# Patient Record
Sex: Male | Born: 1990 | Race: White | Hispanic: No | Marital: Single | Smoking: Current every day smoker
Health system: Southern US, Community
[De-identification: ages and names within clinical notes are randomized; demographics above are authoritative.]

## PROBLEM LIST (undated history)

## (undated) DIAGNOSIS — Z720 Tobacco use: Secondary | ICD-10-CM

## (undated) DIAGNOSIS — K219 Gastro-esophageal reflux disease without esophagitis: Secondary | ICD-10-CM

## (undated) DIAGNOSIS — IMO0002 Reserved for concepts with insufficient information to code with codable children: Secondary | ICD-10-CM

## (undated) HISTORY — PX: APPENDECTOMY: SHX54

---
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
## 2010-02-03 ENCOUNTER — Ambulatory Visit (HOSPITAL_COMMUNITY): Admission: RE | Admit: 2010-02-03 | Discharge: 2010-02-03 | Payer: Self-pay | Admitting: Family Medicine

## 2013-06-04 ENCOUNTER — Encounter (HOSPITAL_COMMUNITY): Payer: Self-pay | Admitting: Emergency Medicine

## 2013-06-04 ENCOUNTER — Emergency Department (HOSPITAL_COMMUNITY): Payer: Worker's Compensation | Admitting: Anesthesiology

## 2013-06-04 ENCOUNTER — Encounter (HOSPITAL_COMMUNITY): Payer: Worker's Compensation | Admitting: Anesthesiology

## 2013-06-04 ENCOUNTER — Encounter (HOSPITAL_COMMUNITY)
Admission: EM | Disposition: A | Discharge status: Home / Self Care | Payer: Self-pay | Source: Home / Self Care | Attending: Emergency Medicine

## 2013-06-04 ENCOUNTER — Ambulatory Visit (HOSPITAL_COMMUNITY)
Admission: EM | Admit: 2013-06-04 | Discharge: 2013-06-04 | Disposition: A | Discharge status: Home / Self Care | Payer: Worker's Compensation | Attending: Emergency Medicine | Admitting: Emergency Medicine

## 2013-06-04 ENCOUNTER — Emergency Department (HOSPITAL_COMMUNITY): Payer: Worker's Compensation

## 2013-06-04 DIAGNOSIS — F17200 Nicotine dependence, unspecified, uncomplicated: Secondary | ICD-10-CM | POA: Insufficient documentation

## 2013-06-04 DIAGNOSIS — S62309B Unspecified fracture of unspecified metacarpal bone, initial encounter for open fracture: Secondary | ICD-10-CM

## 2013-06-04 DIAGNOSIS — S62329B Displaced fracture of shaft of unspecified metacarpal bone, initial encounter for open fracture: Secondary | ICD-10-CM | POA: Insufficient documentation

## 2013-06-04 DIAGNOSIS — Y990 Civilian activity done for income or pay: Secondary | ICD-10-CM | POA: Insufficient documentation

## 2013-06-04 DIAGNOSIS — W3189XA Contact with other specified machinery, initial encounter: Secondary | ICD-10-CM | POA: Insufficient documentation

## 2013-06-04 DIAGNOSIS — S65509A Unspecified injury of blood vessel of unspecified finger, initial encounter: Secondary | ICD-10-CM | POA: Insufficient documentation

## 2013-06-04 DIAGNOSIS — Z7982 Long term (current) use of aspirin: Secondary | ICD-10-CM | POA: Insufficient documentation

## 2013-06-04 DIAGNOSIS — S6710XA Crushing injury of unspecified finger(s), initial encounter: Secondary | ICD-10-CM | POA: Insufficient documentation

## 2013-06-04 DIAGNOSIS — Y9269 Other specified industrial and construction area as the place of occurrence of the external cause: Secondary | ICD-10-CM | POA: Insufficient documentation

## 2013-06-04 HISTORY — PX: OPEN REDUCTION INTERNAL FIXATION (ORIF) HAND: SHX5991

## 2013-06-04 LAB — BASIC METABOLIC PANEL
BUN: 11 mg/dL (ref 6–23)
CO2: 24 meq/L (ref 19–32)
CREATININE: 1.96 mg/dL — AB (ref 0.50–1.35)
Calcium: 8.7 mg/dL (ref 8.4–10.5)
Chloride: 99 mEq/L (ref 96–112)
GFR calc Af Amer: 54 mL/min — ABNORMAL LOW (ref >90)
GFR calc non Af Amer: 46 mL/min — ABNORMAL LOW (ref >90)
Glucose, Bld: 84 mg/dL (ref 70–99)
POTASSIUM: 3.7 meq/L (ref 3.7–5.3)
Sodium: 137 mEq/L (ref 137–147)

## 2013-06-04 LAB — CBC WITH DIFFERENTIAL/PLATELET
Basophils Absolute: 0 10*3/uL (ref 0.0–0.1)
Basophils Relative: 0 % (ref 0–1)
Eosinophils Absolute: 0.2 10*3/uL (ref 0.0–0.7)
Eosinophils Relative: 1 % (ref 0–5)
HEMATOCRIT: 44.6 % (ref 39.0–52.0)
HEMOGLOBIN: 15.5 g/dL (ref 13.0–17.0)
Lymphocytes Relative: 14 % (ref 12–46)
Lymphs Abs: 1.7 10*3/uL (ref 0.7–4.0)
MCH: 32.9 pg (ref 26.0–34.0)
MCHC: 34.8 g/dL (ref 30.0–36.0)
MCV: 94.7 fL (ref 78.0–100.0)
MONO ABS: 1.6 10*3/uL — AB (ref 0.1–1.0)
MONOS PCT: 13 % — AB (ref 3–12)
NEUTROS ABS: 9 10*3/uL — AB (ref 1.7–7.7)
Neutrophils Relative %: 72 % (ref 43–77)
Platelets: 215 10*3/uL (ref 150–400)
RBC: 4.71 MIL/uL (ref 4.22–5.81)
RDW: 13 % (ref 11.5–15.5)
WBC: 12.5 10*3/uL — ABNORMAL HIGH (ref 4.0–10.5)

## 2013-06-04 SURGERY — OPEN REDUCTION INTERNAL FIXATION (ORIF) HAND
Anesthesia: General | Site: Hand | Laterality: Right

## 2013-06-04 MED ORDER — LIDOCAINE HCL (CARDIAC) 20 MG/ML IV SOLN
INTRAVENOUS | Status: AC
  Filled 2013-06-04: qty 5

## 2013-06-04 MED ORDER — DEXAMETHASONE SODIUM PHOSPHATE 4 MG/ML IJ SOLN
INTRAMUSCULAR | Status: DC | PRN
  Administered 2013-06-04: 8 mg via INTRAVENOUS

## 2013-06-04 MED ORDER — MIDAZOLAM HCL 5 MG/5ML IJ SOLN
INTRAMUSCULAR | Status: DC | PRN
  Administered 2013-06-04: 2 mg via INTRAVENOUS

## 2013-06-04 MED ORDER — OXYCODONE-ACETAMINOPHEN 5-325 MG PO TABS: ORAL_TABLET | ORAL | Status: DC

## 2013-06-04 MED ORDER — HYDROMORPHONE HCL PF 1 MG/ML IJ SOLN
1.0000 mg | Freq: Once | INTRAMUSCULAR | Status: AC
  Administered 2013-06-04: 1 mg via INTRAVENOUS
  Filled 2013-06-04: qty 1

## 2013-06-04 MED ORDER — FENTANYL CITRATE 0.05 MG/ML IJ SOLN
INTRAMUSCULAR | Status: AC
  Filled 2013-06-04: qty 5

## 2013-06-04 MED ORDER — LIDOCAINE HCL (CARDIAC) 20 MG/ML IV SOLN
INTRAVENOUS | Status: DC | PRN
  Administered 2013-06-04: 60 mg via INTRAVENOUS

## 2013-06-04 MED ORDER — CEFAZOLIN SODIUM 1-5 GM-% IV SOLN
1.0000 g | Freq: Once | INTRAVENOUS | Status: AC
  Administered 2013-06-04: 1 g via INTRAVENOUS
  Filled 2013-06-04: qty 50

## 2013-06-04 MED ORDER — CEFAZOLIN SODIUM 1-5 GM-% IV SOLN
INTRAVENOUS | Status: DC | PRN
  Administered 2013-06-04: 1 g via INTRAVENOUS

## 2013-06-04 MED ORDER — DEXTROSE 5 % IV SOLN
INTRAVENOUS | Status: DC | PRN
  Administered 2013-06-04: 05:00:00 via INTRAVENOUS

## 2013-06-04 MED ORDER — VECURONIUM BROMIDE 10 MG IV SOLR
INTRAVENOUS | Status: AC
  Filled 2013-06-04: qty 10

## 2013-06-04 MED ORDER — CHLORHEXIDINE GLUCONATE 4 % EX LIQD
60.0000 mL | Freq: Once | CUTANEOUS | Status: DC
Start: 2013-06-04 — End: 2013-06-04

## 2013-06-04 MED ORDER — ONDANSETRON HCL 4 MG/2ML IJ SOLN
INTRAMUSCULAR | Status: DC | PRN
  Administered 2013-06-04 (×2): 4 mg via INTRAVENOUS

## 2013-06-04 MED ORDER — FENTANYL CITRATE 0.05 MG/ML IJ SOLN
INTRAMUSCULAR | Status: DC | PRN
  Administered 2013-06-04 (×3): 50 ug via INTRAVENOUS
  Administered 2013-06-04: 100 ug via INTRAVENOUS

## 2013-06-04 MED ORDER — BUPIVACAINE HCL (PF) 0.25 % IJ SOLN
INTRAMUSCULAR | Status: DC | PRN
  Administered 2013-06-04: 17 mL

## 2013-06-04 MED ORDER — ASPIRIN EC 325 MG PO TBEC: 325.0000 mg | DELAYED_RELEASE_TABLET | Freq: Every day | ORAL | Status: DC

## 2013-06-04 MED ORDER — PROPOFOL 10 MG/ML IV BOLUS
INTRAVENOUS | Status: DC | PRN
  Administered 2013-06-04: 200 mg via INTRAVENOUS

## 2013-06-04 MED ORDER — CEFAZOLIN SODIUM 1-5 GM-% IV SOLN
INTRAVENOUS | Status: AC
  Filled 2013-06-04: qty 50

## 2013-06-04 MED ORDER — MIDAZOLAM HCL 2 MG/2ML IJ SOLN
INTRAMUSCULAR | Status: AC
  Filled 2013-06-04: qty 2

## 2013-06-04 MED ORDER — TETANUS-DIPHTH-ACELL PERTUSSIS 5-2.5-18.5 LF-MCG/0.5 IM SUSP
0.5000 mL | Freq: Once | INTRAMUSCULAR | Status: AC
  Administered 2013-06-04: 0.5 mL via INTRAMUSCULAR
  Filled 2013-06-04: qty 0.5

## 2013-06-04 MED ORDER — SULFAMETHOXAZOLE-TRIMETHOPRIM 800-160 MG PO TABS: 1.0000 | ORAL_TABLET | Freq: Two times a day (BID) | ORAL | Status: DC

## 2013-06-04 MED ORDER — ONDANSETRON HCL 4 MG/2ML IJ SOLN
INTRAMUSCULAR | Status: AC
  Filled 2013-06-04: qty 2

## 2013-06-04 MED ORDER — DEXAMETHASONE SODIUM PHOSPHATE 4 MG/ML IJ SOLN
INTRAMUSCULAR | Status: AC
  Filled 2013-06-04: qty 2

## 2013-06-04 MED ORDER — OXYCODONE HCL 5 MG PO TABS
ORAL_TABLET | ORAL | Status: AC
  Filled 2013-06-04: qty 1

## 2013-06-04 MED ORDER — BUPIVACAINE HCL (PF) 0.25 % IJ SOLN
INTRAMUSCULAR | Status: AC
  Filled 2013-06-04: qty 30

## 2013-06-04 MED ORDER — PROMETHAZINE HCL 25 MG/ML IJ SOLN: 6.2500 mg | INTRAMUSCULAR | Status: DC | PRN

## 2013-06-04 MED ORDER — STERILE WATER FOR INJECTION IJ SOLN
INTRAMUSCULAR | Status: AC
  Filled 2013-06-04: qty 10

## 2013-06-04 MED ORDER — HYDROMORPHONE HCL PF 1 MG/ML IJ SOLN: 0.2500 mg | INTRAMUSCULAR | Status: DC | PRN

## 2013-06-04 MED ORDER — 0.9 % SODIUM CHLORIDE (POUR BTL) OPTIME
TOPICAL | Status: DC | PRN
  Administered 2013-06-04: 1000 mL

## 2013-06-04 MED ORDER — OXYCODONE HCL 5 MG/5ML PO SOLN: 5.0000 mg | Freq: Once | ORAL | Status: AC | PRN

## 2013-06-04 MED ORDER — LACTATED RINGERS IV SOLN
INTRAVENOUS | Status: DC | PRN
  Administered 2013-06-04 (×2): via INTRAVENOUS

## 2013-06-04 MED ORDER — GLYCOPYRROLATE 0.2 MG/ML IJ SOLN
INTRAMUSCULAR | Status: AC
  Filled 2013-06-04: qty 3

## 2013-06-04 MED ORDER — ARTIFICIAL TEARS OP OINT
TOPICAL_OINTMENT | OPHTHALMIC | Status: AC
  Filled 2013-06-04: qty 3.5

## 2013-06-04 MED ORDER — SODIUM CHLORIDE 0.9 % IR SOLN
Status: DC | PRN
  Administered 2013-06-04: 1000 mL

## 2013-06-04 MED ORDER — OXYCODONE HCL 5 MG PO TABS
5.0000 mg | ORAL_TABLET | Freq: Once | ORAL | Status: AC | PRN
  Administered 2013-06-04: 5 mg via ORAL

## 2013-06-04 SURGICAL SUPPLY — 46 items
BANDAGE ELASTIC 3 VELCRO ST LF (GAUZE/BANDAGES/DRESSINGS) ×3 IMPLANT
BANDAGE ELASTIC 4 VELCRO ST LF (GAUZE/BANDAGES/DRESSINGS) IMPLANT
BANDAGE GAUZE ELAST BULKY 4 IN (GAUZE/BANDAGES/DRESSINGS) ×3 IMPLANT
BNDG ESMARK 4X9 LF (GAUZE/BANDAGES/DRESSINGS) ×3 IMPLANT
CLOTH BEACON ORANGE TIMEOUT ST (SAFETY) IMPLANT
CORDS BIPOLAR (ELECTRODE) ×3 IMPLANT
DRAPE MICROSCOPE LEICA (MISCELLANEOUS) ×3 IMPLANT
DRAPE OEC MINIVIEW 54X84 (DRAPES) ×3 IMPLANT
DRAPE SURG 17X23 STRL (DRAPES) ×3 IMPLANT
DRSG ADAPTIC 3X8 NADH LF (GAUZE/BANDAGES/DRESSINGS) IMPLANT
DRSG EMULSION OIL 3X3 NADH (GAUZE/BANDAGES/DRESSINGS) ×3 IMPLANT
DRSG PAD ABDOMINAL 8X10 ST (GAUZE/BANDAGES/DRESSINGS) IMPLANT
GAUZE XEROFORM 1X8 LF (GAUZE/BANDAGES/DRESSINGS) ×6 IMPLANT
GLOVE BIO SURGEON STRL SZ7.5 (GLOVE) ×6 IMPLANT
GLOVE BIOGEL PI IND STRL 8 (GLOVE) ×2 IMPLANT
GLOVE BIOGEL PI INDICATOR 8 (GLOVE) ×4
GOWN STRL REIN XL XLG (GOWN DISPOSABLE) ×3 IMPLANT
HANDPIECE INTERPULSE COAX TIP (DISPOSABLE)
K-WIRE DBL TROCAR .045X4 ×12 IMPLANT
KIT BASIN OR (CUSTOM PROCEDURE TRAY) ×3 IMPLANT
KIT ROOM TURNOVER OR (KITS) ×3 IMPLANT
KWIRE DBL TROCAR .045X4 ×4 IMPLANT
MANIFOLD NEPTUNE II (INSTRUMENTS) IMPLANT
NEEDLE HYPO 25GX1X1/2 BEV (NEEDLE) ×3 IMPLANT
NS IRRIG 1000ML POUR BTL (IV SOLUTION) ×3 IMPLANT
PACK ORTHO EXTREMITY (CUSTOM PROCEDURE TRAY) ×3 IMPLANT
PAD ARMBOARD 7.5X6 YLW CONV (MISCELLANEOUS) ×6 IMPLANT
PAD CAST 3X4 CTTN HI CHSV (CAST SUPPLIES) ×1 IMPLANT
PADDING CAST COTTON 3X4 STRL (CAST SUPPLIES) ×2
SET HNDPC FAN SPRY TIP SCT (DISPOSABLE) IMPLANT
SPEAR EYE SURG WECK-CEL (MISCELLANEOUS) ×3 IMPLANT
SPLINT FIBERGLASS 3X12 (CAST SUPPLIES) ×3 IMPLANT
SPONGE GAUZE 4X4 12PLY (GAUZE/BANDAGES/DRESSINGS) ×3 IMPLANT
SPONGE GAUZE 4X4 12PLY STER LF (GAUZE/BANDAGES/DRESSINGS) ×3 IMPLANT
SPONGE LAP 18X18 X RAY DECT (DISPOSABLE) IMPLANT
SPONGE LAP 4X18 X RAY DECT (DISPOSABLE) IMPLANT
SUT ETHILON 4 0 PS 2 18 (SUTURE) ×6 IMPLANT
SUT ETHILON 9 0 BV130 4 (SUTURE) ×3 IMPLANT
SYR CONTROL 10ML LL (SYRINGE) ×3 IMPLANT
TOWEL OR 17X24 6PK STRL BLUE (TOWEL DISPOSABLE) ×3 IMPLANT
TOWEL OR 17X26 10 PK STRL BLUE (TOWEL DISPOSABLE) ×3 IMPLANT
TUBE CONNECTING 12'X1/4 (SUCTIONS) ×1
TUBE CONNECTING 12X1/4 (SUCTIONS) ×2 IMPLANT
TUBE FEEDING 5FR 15 INCH (TUBING) IMPLANT
UNDERPAD 30X30 INCONTINENT (UNDERPADS AND DIAPERS) ×3 IMPLANT
WATER STERILE IRR 1000ML POUR (IV SOLUTION) IMPLANT

## 2013-06-04 NOTE — ED Notes (Signed)
Patient explains that he was at work attempting to clear a jammed machine, and the machine activated pinning his hand with a quarter sized rod piercing his palm. Mechanism also produced a 2 inch circular hematoma on the back of his hand.

## 2013-06-04 NOTE — H&P (Signed)
Randy InfanteJames T Nichols is an 23 y.o. male.   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  Press type machine with compression over approximately a quarter size area.  Suffered laceration to volar hand.  Brought to [facility] where XR reveal ring finger metacarpal fracture.  Reports previous fracture to digits.  No other injury at this time.    History reviewed. No pertinent past medical history.  History reviewed. No pertinent past surgical history.  History reviewed. No pertinent family history. Social History:  reports that he has been smoking.  He does not have any smokeless tobacco history on file. He reports that he does not drink alcohol or use illicit drugs.  Allergies: Not on File   (Not in a hospital admission)  Results for orders placed during the hospital encounter of 06/04/13 (from the past 48 hour(s))  CBC WITH DIFFERENTIAL     Status: Abnormal   Collection Time    06/04/13  2:41 AM      Result Value Range   WBC 12.5 (*) 4.0 - 10.5 K/uL   RBC 4.71  4.22 - 5.81 MIL/uL   Hemoglobin 15.5  13.0 - 17.0 g/dL   HCT 16.144.6  09.639.0 - 04.552.0 %   MCV 94.7  78.0 - 100.0 fL   MCH 32.9  26.0 - 34.0 pg   MCHC 34.8  30.0 - 36.0 g/dL   RDW 40.913.0  81.111.5 - 91.415.5 %   Platelets 215  150 - 400 K/uL   Neutrophils Relative % 72  43 - 77 %   Neutro Abs 9.0 (*) 1.7 - 7.7 K/uL   Lymphocytes Relative 14  12 - 46 %   Lymphs Abs 1.7  0.7 - 4.0 K/uL   Monocytes Relative 13 (*) 3 - 12 %   Monocytes Absolute 1.6 (*) 0.1 - 1.0 K/uL   Eosinophils Relative 1  0 - 5 %   Eosinophils Absolute 0.2  0.0 - 0.7 K/uL   Basophils Relative 0  0 - 1 %   Basophils Absolute 0.0  0.0 - 0.1 K/uL    Dg Hand Complete Right  06/04/2013   CLINICAL DATA:  Puncture wound to hand.  EXAM: RIGHT HAND - COMPLETE 3+ VIEW  COMPARISON:  None.  FINDINGS: There is a comminuted fracture of the shaft of the fourth metacarpal, which demonstrates dorsal displacement and palmar angulation.  There is overlying soft tissue swelling with a small amount of soft tissue emphysema. There is no dislocation. Other bones of the hand appear intact. No radiopaque foreign body is seen.  IMPRESSION: Open, comminuted, mildly displaced and angulated fracture of the fourth metacarpal.   Electronically Signed   By: Sebastian AcheAllen  Grady   On: 06/04/2013 02:43     A comprehensive review of systems was negative.  Blood pressure 147/70, pulse 90, temperature 98.2 F (36.8 C), temperature source Oral, resp. rate 16, height 6' (1.829 m), weight 230 lb (104.327 kg), SpO2 97.00%.  General appearance: alert, cooperative and appears stated age Head: Normocephalic, without obvious abnormality, atraumatic Neck: supple, symmetrical, trachea midline Resp: clear to auscultation bilaterally Cardio: regular rate and rhythm GI: non tender Extremities: intact sensation and capillary refill all digits.  notes some decrease in sensation of very tip of long finger where he states it was pinched.  wound volarly over ring metacarpal.  bleeding controlled.  ecchymosis dorsally.  non tender over index, long, small metacarpals.  ttp at  ring metacarpal.  +epl/fpl/io +fds/fdp all digits.   Pulses: 2+ and symmetric Skin: Skin color, texture, turgor normal. No rashes or lesions Neurologic: Grossly normal Incision/Wound: As above  Assessment/Plan Right hand ring finger metacarpal fracture and hand wound.  Recommend exploration and I&D in OR with pinning of fracture and repair of other structures as necessary.  Risks, benefits, and alternatives of surgery were discussed and the patient agrees with the plan of care.   [name] 06/04/2013, 3:29 AM

## 2013-06-04 NOTE — Anesthesia Preprocedure Evaluation (Signed)
Anesthesia Evaluation  Patient identified by MRN, date of birth, ID band Patient awake    Reviewed: Allergy & Precautions, H&P , NPO status , Patient's Chart, lab work & pertinent test results  Airway Mallampati: II TM Distance: >3 FB Neck ROM: full    Dental  (+) Teeth Intact and Dental Advidsory Given   Pulmonary Current Smoker,  breath sounds clear to auscultation        Cardiovascular negative cardio ROS  Rhythm:regular Rate:Normal     Neuro/Psych negative neurological ROS  negative psych ROS   GI/Hepatic negative GI ROS, Neg liver ROS,   Endo/Other  negative endocrine ROS  Renal/GU negative Renal ROS     Musculoskeletal   Abdominal   Peds  Hematology   Anesthesia Other Findings   Reproductive/Obstetrics negative OB ROS                           Anesthesia Physical Anesthesia Plan  ASA: II and emergent  Anesthesia Plan: General LMA   Post-op Pain Management:    Induction:   Airway Management Planned:   Additional Equipment:   Intra-op Plan:   Post-operative Plan:   Informed Consent: I have reviewed the patients History and Physical, chart, labs and discussed the procedure including the risks, benefits and alternatives for the proposed anesthesia with the patient or authorized representative who has indicated his/her understanding and acceptance.   Dental Advisory Given  Plan Discussed with: Anesthesiologist, CRNA and Surgeon  Anesthesia Plan Comments:         Anesthesia Quick Evaluation

## 2013-06-04 NOTE — ED Notes (Addendum)
While at work, a metal bar made moderate size puncture wound in the palm of rt. Hand. Another metal object bruised the anterior side of rt. Hand. Some numbness in rt. Middle finger. Cap. Refill wnl. Bleeding controlled. Hard to bend fingers at times. Believes the bones in his hand are broke. Bleeding controlled.

## 2013-06-04 NOTE — Anesthesia Postprocedure Evaluation (Signed)
  Anesthesia Post-op Note  Patient: Randy Nichols  Procedure(s) Performed: Procedure(s): OPEN REDUCTION INTERNAL FIXATION (ORIF) HAND (Right)  Patient Location: PACU  Anesthesia Type:General  Level of Consciousness: awake and alert   Airway and Oxygen Therapy: Patient Spontanous Breathing  Post-op Pain: mild  Post-op Assessment: Post-op Vital signs reviewed, Patient's Cardiovascular Status Stable and Respiratory Function Stable  Post-op Vital Signs: Reviewed  Filed Vitals:   06/04/13 0801  BP: 145/83  Pulse: 88  Temp:   Resp: 14    Complications: No apparent anesthesia complications

## 2013-06-04 NOTE — Op Note (Signed)
[id number] 

## 2013-06-04 NOTE — Transfer of Care (Signed)
Immediate Anesthesia Transfer of Care Note  Patient: Randy Nichols  Procedure(s) Performed: Procedure(s): OPEN REDUCTION INTERNAL FIXATION (ORIF) HAND (Right)  Patient Location: PACU  Anesthesia Type:General  Level of Consciousness: awake and alert   Airway & Oxygen Therapy: Patient Spontanous Breathing and Patient connected to nasal cannula oxygen  Post-op Assessment: Report given to PACU RN, Post -op Vital signs reviewed and stable and Patient moving all extremities X 4  Post vital signs: Reviewed and stable  Complications: No apparent anesthesia complications

## 2013-06-04 NOTE — Op Note (Signed)
NAME:  Randy Nichols, Randy Nichols NO.:  [id number]  MEDICAL RECORD NO.:  [id number]  LOCATION:  [facility]                         FACILITY:  [facility]  PHYSICIAN:  Betha Loa, MD        DATE OF BIRTH:  June 12, 1990  DATE OF PROCEDURE:  06/04/2013 DATE OF DISCHARGE:                              OPERATIVE REPORT   PREOPERATIVE DIAGNOSIS:  Right hand crush injury with ring finger open metacarpal fracture and volar laceration.  POSTOPERATIVE DIAGNOSIS:  Right hand crush injury with ring finger metacarpal comminuted fracture with laceration to common digital artery to long and ring finger.  PROCEDURE:   1. Right hand irrigation and debridement of open right ring finger   metacarpal fracture 2. Open reduction and pinning of ring finger metacarpal fracture 3. Prophylactic fasciotomy 4. Repair of common digital artery to long and ring finger under microscope  SURGEON:  Betha Loa, MD  ASSISTANT:  None.  ANESTHESIA:  General.  IV FLUIDS:  Per Anesthesia flow sheet.  ESTIMATED BLOOD LOSS:  Minimal.  COMPLICATIONS:  None.  SPECIMENS:  None.  TOURNIQUET TIME:  101 minutes.  DISPOSITION:  Stable to PACU.  INDICATIONS:  Randy Nichols is a 23 year old right-hand dominant male who at work earlier this morning, suffered a crushing-type injury to the right hand.  This was a press type machine with approximately quarter- size press.  It crushed the palm of the right hand.  He presented to [facility] where radiographs were taken revealing a comminuted fracture of the ring finger metacarpal and open wound volarly.  I was consulted for management of injury and had intact sensation, capillary refill in the fingertips.  I recommended operative exploration, irrigation and debridement and pinning with repair of associated injuries as necessary.  Risks, benefits, and alternatives of surgery were discussed including risk of blood loss, infection, damage to nerves, vessels,  tendons, ligaments, bone; failure of surgery; need for additional surgery, complications with wound healing, continued pain, nonunion, malunion, stiffness.  He voiced understanding of these risks and elected to proceed.  OPERATIVE COURSE:  After being identified preoperatively by myself, the patient and I agreed upon procedure and site of procedure.  Surgical site was marked.  The risks, benefits, and alternatives of surgery were reviewed and he wished to proceed.  Surgical consent had been signed.  He was given IV Ancef as preoperative antibiotic prophylaxis, as well as in the emergency department.  His tetanus had been updated.  He was transferred to the operating room and placed on the operating room table in supine position with the right upper extremity on arm board.  General anesthesia was induced by anesthesiologist.  The right upper extremity was prepped and draped in normal sterile orthopedic fashion.  A surgical pause was performed between surgeons, anesthesia, and operating staff, and all were in agreement as to the patient, procedure, and site of procedure.  Tourniquet at the proximal aspect of the extremity was inflated to 250 mmHg after exsanguination of the limb with Esmarch bandage.  The wound was explored.  There was dermal tissue within the wound.  This was removed.  There was a laceration of the common digital  artery to the long and ring finger noted.  The artery to the ring small was intact.  The artery to the index/long, was outside the zone of injury.  The median nerve was identified and was intact.  The ulnar nerve was identified and was intact.  There was some fraying of the FDP tendon to the small finger.  This was not enough to be repaired.  It was debrided.  The FDP and FDS to the ring finger were identified and were intact without significant injury.  The fracture could be felt through the volar wound.  The wound was copiously irrigated with 3000 mL of sterile  saline by cysto-tubing.  Incision was made on the dorsum of the hand over the ring finger metacarpal.  There was swelling in this area. It was carried into subcutaneous tissues by spreading technique. Fasciotomy of the space between the long and ring, and ring and small finger metacarpals was performed as prophylaxis against compartment syndrome due to the crushing-type injury.  The C-arm was used to aid in reduction of the ring finger metacarpal fracture.  This was comminuted in the shaft.  There was a separate fragment of the head.  A 0.045-inch K-wires were then advanced from the small finger metacarpal into the ring finger metacarpal.  This was adequate to provide good stabilization of the fracture in acceptable reduction.  The dorsal wound was irrigated and closed with 4-0 nylon in a horizontal mattress fashion.  The extensor tendons had been identified and were intact.  Attention was returned to the volar wound.  The microscope was brought in.  The adventitia was removed from the common digital artery.  The artery was cleared of clot.  The arterial ends were freshened with a straight scissors.  A 9-0 nylon suture was used in an interrupted circumferential fashion to repair the artery.  Good apposition of the tissues was obtained.  The tourniquet was deflated at 101 minute.  There was good flow through the anastomosis.  Bleeding was controlled with bipolar electrocautery.  The wound was again irrigated with sterile saline.  It was then closed with 4-0 nylon in a horizontal mattress fashion.  The pins had been bent and cut short.  The wounds in pin sites had been injected with 17 mL of 0.25% plain Marcaine to aid in postoperative analgesia.  They were then dressed with sterile Xeroform, 4x4s, and wrapped with a Kerlix bandage.  A dorsal splint was placed including the long, ring, and small fingers with the MPs flexed and the IPs extended. This was wrapped with Kerlix and Ace bandage.   Tourniquet had been deflated at 101 minutes.  Fingertips were all pink with brisk capillary refill.  The operative drapes were broken down.  The patient was awoken from anesthesia safely.  He was transferred back to stretcher and taken to PACU in stable condition.  I will see him back in the office in 1 week for postoperative followup.  I will give him Percocet 5/325, 1-2 p.o. q.6 hours p.r.n. pain, dispensed #40, and Bactrim DS 1 p.o. b.i.d. x7 days.     Betha LoaKevin Zuri Bradway, MD     KK/MEDQ  D:  06/04/2013  T:  06/04/2013  Job:  [id number]

## 2013-06-04 NOTE — ED Provider Notes (Signed)
CSN: [id number]     Arrival date & time 06/04/13  0131 History   First MD Initiated Contact with Patient 06/04/13 0203     Chief Complaint  Patient presents with  . Puncture Wound   (Consider location/radiation/quality/duration/timing/severity/associated sxs/prior Treatment) HPI 23 year old male presents to emergency room with injury to his right hand.  Patient reports he was at work when a machine became jammed.  He placed his hand into the machine to un-jam it.  He describes the machine as a mold press.  He reports the 2 pieces that press together are about the size of a quarter.  He reports while he had his hand in the press, it began to work and crushed his hand.  He last ate at 4 pm.  He has unknown last tetanus. History reviewed. No pertinent past medical history. History reviewed. No pertinent past surgical history. History reviewed. No pertinent family history. History  Substance Use Topics  . Smoking status: Current Every Day Smoker  . Smokeless tobacco: Not on file  . Alcohol Use: No    Review of Systems  See History of Present Illness; otherwise all other systems are reviewed and negative Allergies  Penicillins  Home Medications   Current Outpatient Rx  Name  Route  Sig  Dispense  Refill  . aspirin EC 325 MG tablet   Oral   Take 1 tablet (325 mg total) by mouth daily.   30 tablet   0   . oxyCODONE-acetaminophen (PERCOCET) 5-325 MG per tablet      1-2 tabs po q6 hours prn pain   40 tablet   0   . sulfamethoxazole-trimethoprim (BACTRIM DS) 800-160 MG per tablet   Oral   Take 1 tablet by mouth 2 (two) times daily.   14 tablet   0    BP 136/83  Pulse 92  Temp(Src) 98.3 F (36.8 C) (Oral)  Resp 20  Ht 6' (1.829 m)  Wt 230 lb (104.327 kg)  BMI 31.19 kg/m2  SpO2 99% Physical Exam  Nursing note and vitals reviewed. Constitutional: He is oriented to person, place, and time. He appears well-developed and well-nourished. He appears distressed.  HENT:  Head:  Normocephalic and atraumatic.  Nose: Nose normal.  Mouth/Throat: Oropharynx is clear and moist.  Eyes: Conjunctivae and EOM are normal. Pupils are equal, round, and reactive to light.  Neck: Normal range of motion. Neck supple. No JVD present. No tracheal deviation present. No thyromegaly present.  Cardiovascular: Normal rate, regular rhythm, normal heart sounds and intact distal pulses.  Exam reveals no gallop and no friction rub.   No murmur heard. Pulmonary/Chest: Effort normal and breath sounds normal. No stridor. No respiratory distress. He has no wheezes. He has no rales. He exhibits no tenderness.  Abdominal: Soft. Bowel sounds are normal. He exhibits no distension and no mass. There is no tenderness. There is no rebound and no guarding.  Musculoskeletal: He exhibits tenderness. He exhibits no edema.  4 cm laceration to right palm of hand with visible tendon.  He has deformity immediately opposite that on the dorsum of the hand.  He has decreased range of motion mainly of his ring and pinky finger.  Sensation is normal.  Pulses are normal, Refill is normal  Lymphadenopathy:    He has no cervical adenopathy.  Neurological: He is alert and oriented to person, place, and time. He exhibits normal muscle tone. Coordination normal.  Skin: Skin is warm and dry. No rash noted. No erythema. No  pallor.  Psychiatric: He has a normal mood and affect. His behavior is normal. Judgment and thought content normal.    ED Course  Procedures (including critical care time) Labs Review Labs Reviewed  CBC WITH DIFFERENTIAL - Abnormal; Notable for the following:    WBC 12.5 (*)    Neutro Abs 9.0 (*)    Monocytes Relative 13 (*)    Monocytes Absolute 1.6 (*)    All other components within normal limits  BASIC METABOLIC PANEL - Abnormal; Notable for the following:    Creatinine, Ser 1.96 (*)    GFR calc non Af Amer 46 (*)    GFR calc Af Amer 54 (*)    All other components within normal limits    Imaging Review Dg Hand Complete Right  06/04/2013   CLINICAL DATA:  Puncture wound to hand.  EXAM: RIGHT HAND - COMPLETE 3+ VIEW  COMPARISON:  None.  FINDINGS: There is a comminuted fracture of the shaft of the fourth metacarpal, which demonstrates dorsal displacement and palmar angulation. There is overlying soft tissue swelling with a small amount of soft tissue emphysema. There is no dislocation. Other bones of the hand appear intact. No radiopaque foreign body is seen.  IMPRESSION: Open, comminuted, mildly displaced and angulated fracture of the fourth metacarpal.   Electronically Signed   By: Sebastian AcheAllen  Grady   On: 06/04/2013 02:43      MDM   1. Open fracture of metacarpal of left hand    23 year old male with crushtype injury to right dominant hand with open fracture of fourth metacarpal.  Case was discussed with Dr. Merlyn LotKuzma, who has seen the patient in the emergency department and plans to take to the operating room.    Olivia Mackielga M Yeiren Whitecotton, MD 06/05/13 [phone]

## 2013-06-04 NOTE — Brief Op Note (Signed)
06/04/2013  7:34 AM  PATIENT:  Crist Infante  23 y.o. male  PRE-OPERATIVE DIAGNOSIS:  trauma right hand  POST-OPERATIVE DIAGNOSIS:  trauma right hand  PROCEDURE:  Procedure(s): OPEN REDUCTION INTERNAL FIXATION (ORIF) HAND (Right)  SURGEON:  Surgeon(s) and Role:    * Tami Ribas, MD - Primary  PHYSICIAN ASSISTANT:   ASSISTANTS: none   ANESTHESIA:   general  EBL:  Total I/O In: 100 [I.V.:100] Out: -   BLOOD ADMINISTERED:none  DRAINS: none   LOCAL MEDICATIONS USED:  MARCAINE     SPECIMEN:  No Specimen  DISPOSITION OF SPECIMEN:  N/A  COUNTS:  YES  TOURNIQUET:   Total Tourniquet Time Documented: Upper Arm (Right) - 101 minutes Total: Upper Arm (Right) - 101 minutes   DICTATION: .Other Dictation: Dictation Number [id number]  PLAN OF CARE: Discharge to home after PACU  PATIENT DISPOSITION:  PACU - hemodynamically stable.

## 2013-06-04 NOTE — Preoperative (Signed)
Beta Blockers   Reason not to administer Beta Blockers:Not Applicable 

## 2013-06-04 NOTE — Discharge Instructions (Signed)

## 2013-06-04 NOTE — Anesthesia Procedure Notes (Signed)
Procedure Name: LMA Insertion Date/Time: 06/04/2013 5:05 AM Performed by: Wray KearnsFOLEY, Gisselle Galvis A Pre-anesthesia Checklist: Patient identified, Timeout performed, Emergency Drugs available, Suction available and Patient being monitored Patient Re-evaluated:Patient Re-evaluated prior to inductionOxygen Delivery Method: Circle system utilized Preoxygenation: Pre-oxygenation with 100% oxygen Intubation Type: IV induction Ventilation: Mask ventilation without difficulty LMA: LMA with gastric port inserted LMA Size: 5.0 Tube type: Oral Number of attempts: 1 Placement Confirmation: breath sounds checked- equal and bilateral and positive ETCO2 Tube secured with: Tape Dental Injury: Teeth and Oropharynx as per pre-operative assessment

## 2013-06-05 ENCOUNTER — Encounter (HOSPITAL_COMMUNITY): Payer: Self-pay | Admitting: Orthopedic Surgery

## 2013-07-26 DIAGNOSIS — IMO0002 Reserved for concepts with insufficient information to code with codable children: Secondary | ICD-10-CM

## 2013-07-26 HISTORY — DX: Reserved for concepts with insufficient information to code with codable children: IMO0002

## 2013-08-23 ENCOUNTER — Encounter (HOSPITAL_BASED_OUTPATIENT_CLINIC_OR_DEPARTMENT_OTHER): Payer: Self-pay | Admitting: *Deleted

## 2013-08-25 ENCOUNTER — Other Ambulatory Visit: Payer: Self-pay | Admitting: Orthopedic Surgery

## 2013-08-29 ENCOUNTER — Ambulatory Visit (HOSPITAL_BASED_OUTPATIENT_CLINIC_OR_DEPARTMENT_OTHER)
Admission: RE | Admit: 2013-08-29 | Discharge: 2013-08-29 | Disposition: A | Discharge status: Home / Self Care | Payer: Worker's Compensation | Source: Ambulatory Visit | Attending: Orthopedic Surgery | Admitting: Orthopedic Surgery

## 2013-08-29 ENCOUNTER — Ambulatory Visit (HOSPITAL_BASED_OUTPATIENT_CLINIC_OR_DEPARTMENT_OTHER): Payer: Worker's Compensation | Admitting: Certified Registered"

## 2013-08-29 ENCOUNTER — Encounter (HOSPITAL_BASED_OUTPATIENT_CLINIC_OR_DEPARTMENT_OTHER)
Admission: RE | Disposition: A | Discharge status: Home / Self Care | Payer: Self-pay | Source: Ambulatory Visit | Attending: Orthopedic Surgery

## 2013-08-29 ENCOUNTER — Encounter (HOSPITAL_BASED_OUTPATIENT_CLINIC_OR_DEPARTMENT_OTHER): Payer: Self-pay | Admitting: Certified Registered"

## 2013-08-29 ENCOUNTER — Encounter (HOSPITAL_BASED_OUTPATIENT_CLINIC_OR_DEPARTMENT_OTHER): Payer: Worker's Compensation | Admitting: Certified Registered"

## 2013-08-29 DIAGNOSIS — K219 Gastro-esophageal reflux disease without esophagitis: Secondary | ICD-10-CM | POA: Diagnosis not present

## 2013-08-29 DIAGNOSIS — Z880 Allergy status to penicillin: Secondary | ICD-10-CM | POA: Diagnosis not present

## 2013-08-29 DIAGNOSIS — Z79899 Other long term (current) drug therapy: Secondary | ICD-10-CM | POA: Insufficient documentation

## 2013-08-29 DIAGNOSIS — IMO0002 Reserved for concepts with insufficient information to code with codable children: Secondary | ICD-10-CM | POA: Insufficient documentation

## 2013-08-29 DIAGNOSIS — F17200 Nicotine dependence, unspecified, uncomplicated: Secondary | ICD-10-CM | POA: Insufficient documentation

## 2013-08-29 HISTORY — DX: Gastro-esophageal reflux disease without esophagitis: K21.9

## 2013-08-29 HISTORY — PX: OPEN REDUCTION INTERNAL FIXATION (ORIF) METACARPAL: SHX6234

## 2013-08-29 HISTORY — DX: Reserved for concepts with insufficient information to code with codable children: IMO0002

## 2013-08-29 LAB — POCT HEMOGLOBIN-HEMACUE: Hemoglobin: 18 g/dL — ABNORMAL HIGH (ref 13.0–17.0)

## 2013-08-29 SURGERY — OPEN REDUCTION INTERNAL FIXATION (ORIF) METACARPAL
Anesthesia: General | Site: Finger | Laterality: Right

## 2013-08-29 MED ORDER — PROPOFOL 10 MG/ML IV BOLUS
INTRAVENOUS | Status: DC | PRN
  Administered 2013-08-29: 200 mg via INTRAVENOUS

## 2013-08-29 MED ORDER — OXYCODONE-ACETAMINOPHEN 5-325 MG PO TABS: ORAL_TABLET | ORAL | Status: DC

## 2013-08-29 MED ORDER — VANCOMYCIN HCL IN DEXTROSE 500-5 MG/100ML-% IV SOLN
INTRAVENOUS | Status: AC
  Filled 2013-08-29: qty 100

## 2013-08-29 MED ORDER — VANCOMYCIN HCL IN DEXTROSE 1-5 GM/200ML-% IV SOLN
INTRAVENOUS | Status: AC
  Filled 2013-08-29: qty 200

## 2013-08-29 MED ORDER — ONDANSETRON HCL 4 MG/2ML IJ SOLN
INTRAMUSCULAR | Status: DC | PRN
  Administered 2013-08-29: 4 mg via INTRAVENOUS

## 2013-08-29 MED ORDER — DEXAMETHASONE SODIUM PHOSPHATE 10 MG/ML IJ SOLN
INTRAMUSCULAR | Status: DC | PRN
  Administered 2013-08-29: 10 mg via INTRAVENOUS

## 2013-08-29 MED ORDER — MIDAZOLAM HCL 2 MG/2ML IJ SOLN
INTRAMUSCULAR | Status: AC
  Filled 2013-08-29: qty 2

## 2013-08-29 MED ORDER — FENTANYL CITRATE 0.05 MG/ML IJ SOLN
50.0000 ug | INTRAMUSCULAR | Status: DC | PRN
  Administered 2013-08-29: 100 ug via INTRAVENOUS

## 2013-08-29 MED ORDER — MIDAZOLAM HCL 5 MG/5ML IJ SOLN
INTRAMUSCULAR | Status: DC | PRN
  Administered 2013-08-29: 2 mg via INTRAVENOUS

## 2013-08-29 MED ORDER — ONDANSETRON HCL 4 MG/2ML IJ SOLN: 4.0000 mg | Freq: Once | INTRAMUSCULAR | Status: DC | PRN

## 2013-08-29 MED ORDER — CHLORHEXIDINE GLUCONATE 4 % EX LIQD
60.0000 mL | Freq: Once | CUTANEOUS | Status: AC
  Administered 2013-08-29: 4 via TOPICAL

## 2013-08-29 MED ORDER — LIDOCAINE HCL (CARDIAC) 20 MG/ML IV SOLN
INTRAVENOUS | Status: DC | PRN
  Administered 2013-08-29: 50 mg via INTRAVENOUS

## 2013-08-29 MED ORDER — FENTANYL CITRATE 0.05 MG/ML IJ SOLN
INTRAMUSCULAR | Status: AC
  Filled 2013-08-29: qty 6

## 2013-08-29 MED ORDER — MIDAZOLAM HCL 2 MG/ML PO SYRP: 12.0000 mg | ORAL_SOLUTION | Freq: Once | ORAL | Status: DC | PRN

## 2013-08-29 MED ORDER — MIDAZOLAM HCL 2 MG/2ML IJ SOLN
1.0000 mg | INTRAMUSCULAR | Status: DC | PRN
  Administered 2013-08-29: 2 mg via INTRAVENOUS

## 2013-08-29 MED ORDER — HYDROMORPHONE HCL PF 1 MG/ML IJ SOLN: 0.2500 mg | INTRAMUSCULAR | Status: DC | PRN

## 2013-08-29 MED ORDER — FENTANYL CITRATE 0.05 MG/ML IJ SOLN
INTRAMUSCULAR | Status: AC
  Filled 2013-08-29: qty 2

## 2013-08-29 MED ORDER — FENTANYL CITRATE 0.05 MG/ML IJ SOLN
INTRAMUSCULAR | Status: DC | PRN
  Administered 2013-08-29: 100 ug via INTRAVENOUS

## 2013-08-29 MED ORDER — OXYCODONE HCL 5 MG/5ML PO SOLN: 5.0000 mg | Freq: Once | ORAL | Status: DC | PRN

## 2013-08-29 MED ORDER — OXYCODONE HCL 5 MG PO TABS: 5.0000 mg | ORAL_TABLET | Freq: Once | ORAL | Status: DC | PRN

## 2013-08-29 MED ORDER — LACTATED RINGERS IV SOLN
INTRAVENOUS | Status: DC
  Administered 2013-08-29 (×2): via INTRAVENOUS

## 2013-08-29 MED ORDER — VANCOMYCIN HCL 10 G IV SOLR
1500.0000 mg | INTRAVENOUS | Status: AC
  Administered 2013-08-29 (×2): 1500 mg via INTRAVENOUS

## 2013-08-29 SURGICAL SUPPLY — 73 items
BANDAGE ELASTIC 3 VELCRO ST LF (GAUZE/BANDAGES/DRESSINGS) IMPLANT
BIT DRILL 1.1 (BIT) ×1
BIT DRILL 1.1MM (BIT) ×1
BIT DRILL 60X20X1.1XQC TMX (BIT) ×1 IMPLANT
BIT DRL 60X20X1.1XQC TMX (BIT) ×1
BLADE 15 SAFETY STRL DISP (BLADE) IMPLANT
BLADE MINI RND TIP GREEN BEAV (BLADE) IMPLANT
BLADE SURG 15 STRL LF DISP TIS (BLADE) ×2 IMPLANT
BLADE SURG 15 STRL SS (BLADE) ×4
BNDG ESMARK 4X9 LF (GAUZE/BANDAGES/DRESSINGS) ×3 IMPLANT
BNDG GAUZE ELAST 4 BULKY (GAUZE/BANDAGES/DRESSINGS) ×3 IMPLANT
CHLORAPREP W/TINT 26ML (MISCELLANEOUS) ×3 IMPLANT
CORDS BIPOLAR (ELECTRODE) ×3 IMPLANT
COVER MAYO STAND STRL (DRAPES) ×3 IMPLANT
COVER TABLE BACK 60X90 (DRAPES) ×3 IMPLANT
CUFF TOURNIQUET SINGLE 18IN (TOURNIQUET CUFF) IMPLANT
CUFF TOURNIQUET SINGLE 24IN (TOURNIQUET CUFF) ×3 IMPLANT
DRAPE EXTREMITY T 121X128X90 (DRAPE) ×3 IMPLANT
DRAPE OEC MINIVIEW 54X84 (DRAPES) ×3 IMPLANT
DRAPE SURG 17X23 STRL (DRAPES) ×3 IMPLANT
DRIVER BIT 1.5 (TRAUMA) ×3 IMPLANT
GAUZE SPONGE 4X4 12PLY STRL (GAUZE/BANDAGES/DRESSINGS) ×3 IMPLANT
GAUZE XEROFORM 1X8 LF (GAUZE/BANDAGES/DRESSINGS) ×3 IMPLANT
GLOVE BIO SURGEON STRL SZ7.5 (GLOVE) ×3 IMPLANT
GLOVE BIOGEL PI IND STRL 7.0 (GLOVE) ×1 IMPLANT
GLOVE BIOGEL PI IND STRL 8 (GLOVE) ×1 IMPLANT
GLOVE BIOGEL PI IND STRL 8.5 (GLOVE) ×1 IMPLANT
GLOVE BIOGEL PI INDICATOR 7.0 (GLOVE) ×2
GLOVE BIOGEL PI INDICATOR 8 (GLOVE) ×2
GLOVE BIOGEL PI INDICATOR 8.5 (GLOVE) ×2
GLOVE ECLIPSE 7.0 STRL STRAW (GLOVE) ×3 IMPLANT
GLOVE SURG ORTHO 8.0 STRL STRW (GLOVE) ×3 IMPLANT
GOWN STRL REUS W/ TWL LRG LVL3 (GOWN DISPOSABLE) ×1 IMPLANT
GOWN STRL REUS W/ TWL XL LVL3 (GOWN DISPOSABLE) IMPLANT
GOWN STRL REUS W/TWL LRG LVL3 (GOWN DISPOSABLE) ×2
GOWN STRL REUS W/TWL XL LVL3 (GOWN DISPOSABLE) ×6 IMPLANT
K-WIRE DBL TRONS .035X6 (WIRE) ×3
KWIRE DBL TRONS .035X6 (WIRE) ×1 IMPLANT
NEEDLE HYPO 22GX1.5 SAFETY (NEEDLE) IMPLANT
NEEDLE HYPO 25X1 1.5 SAFETY (NEEDLE) ×3 IMPLANT
NS IRRIG 1000ML POUR BTL (IV SOLUTION) ×3 IMPLANT
PACK BASIN DAY SURGERY FS (CUSTOM PROCEDURE TRAY) ×3 IMPLANT
PAD CAST 3X4 CTTN HI CHSV (CAST SUPPLIES) IMPLANT
PAD CAST 4YDX4 CTTN HI CHSV (CAST SUPPLIES) ×1 IMPLANT
PADDING CAST ABS 4INX4YD NS (CAST SUPPLIES)
PADDING CAST ABS COTTON 4X4 ST (CAST SUPPLIES) IMPLANT
PADDING CAST COTTON 3X4 STRL (CAST SUPPLIES)
PADDING CAST COTTON 4X4 STRL (CAST SUPPLIES) ×2
PLATE LOCKING 1.5 Y SHAPE (Plate) ×3 IMPLANT
SCREW 1.5X15MM (Screw) ×3 IMPLANT
SCREW 1.5X8 (Screw) ×3 IMPLANT
SCREW CORTICAL 1.5X9MM WRIST (Screw) ×3 IMPLANT
SCREW NL 1.5X11 WRIST (Screw) ×12 IMPLANT
SCREW NL 1.5X12 (Screw) ×3 IMPLANT
SCREW NL 1.5X13 (Screw) ×3 IMPLANT
SCREW NONIOC 1.5 16M (Screw) ×3 IMPLANT
SLEEVE SCD COMPRESS KNEE MED (MISCELLANEOUS) ×3 IMPLANT
SLING ARM XL FOAM STRAP (SOFTGOODS) ×3 IMPLANT
SPLINT PLASTER CAST XFAST 3X15 (CAST SUPPLIES) ×20 IMPLANT
SPLINT PLASTER CAST XFAST 4X15 (CAST SUPPLIES) IMPLANT
SPLINT PLASTER XTRA FAST SET 4 (CAST SUPPLIES)
SPLINT PLASTER XTRA FASTSET 3X (CAST SUPPLIES) ×40
STOCKINETTE 4X48 STRL (DRAPES) ×3 IMPLANT
SUT ETHILON 3 0 PS 1 (SUTURE) IMPLANT
SUT ETHILON 4 0 PS 2 18 (SUTURE) ×6 IMPLANT
SUT MERSILENE 4 0 P 3 (SUTURE) ×3 IMPLANT
SUT VIC AB 3-0 PS1 18 (SUTURE)
SUT VIC AB 3-0 PS1 18XBRD (SUTURE) IMPLANT
SUT VICRYL 4-0 PS2 18IN ABS (SUTURE) ×3 IMPLANT
SYR BULB 3OZ (MISCELLANEOUS) ×3 IMPLANT
SYR CONTROL 10ML LL (SYRINGE) IMPLANT
TOWEL OR 17X24 6PK STRL BLUE (TOWEL DISPOSABLE) ×6 IMPLANT
UNDERPAD 30X30 INCONTINENT (UNDERPADS AND DIAPERS) ×3 IMPLANT

## 2013-08-29 NOTE — Anesthesia Procedure Notes (Addendum)
Anesthesia Regional Block:  Supraclavicular block  Pre-Anesthetic Checklist: ,, timeout performed, Correct Patient, Correct Site, Correct Laterality, Correct Procedure, Correct Position, site marked, Risks and benefits discussed,  Surgical consent,  Pre-op evaluation,  At surgeon's request and post-op pain management  Laterality: Right  Prep: chloraprep       Needles:  Injection technique: Single-shot  Needle Type: Echogenic Stimulator Needle     Needle Length: 9cm 9 cm Needle Gauge: 22 and 22 G    Additional Needles:  Procedures: ultrasound guided (picture in chart) Supraclavicular block Narrative:  Start time: 08/29/2013 1:00 PM End time: 08/29/2013 1:05 PM Injection made incrementally with aspirations every 5 mL.  Performed by: Personally   Additional Notes: 25 cc 0.5% Naropin injected easily   Procedure Name: LMA Insertion Performed by: Lance CoonWEBSTER, [place] Pre-anesthesia Checklist: Patient identified, Emergency Drugs available, Suction available and Patient being monitored Patient Re-evaluated:Patient Re-evaluated prior to inductionOxygen Delivery Method: Circle System Utilized Preoxygenation: Pre-oxygenation with 100% oxygen Intubation Type: IV induction Ventilation: Mask ventilation without difficulty LMA: LMA inserted LMA Size: 4.0 Number of attempts: 1 Airway Equipment and Method: bite block Placement Confirmation: positive ETCO2 Tube secured with: Tape Dental Injury: Teeth and Oropharynx as per pre-operative assessment

## 2013-08-29 NOTE — H&P (Signed)
  Randy Nichols is an 23 y.o. male.   Chief Complaint: right ring metacarpal nonunion HPI: 23 yo rhd male suffered open fracture to right hand 06/04/13.  Underwent I&D and pinning day of injury.  Has had failure to heal metacarpal.  Returns today for ORIF with bone grafting.  Past Medical History  Diagnosis Date  . GERD (gastroesophageal reflux disease)     TUMS as needed  . 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 Open reduction internal fixation (orif) hand Right 06/04/2013    Procedure: OPEN REDUCTION INTERNAL FIXATION (ORIF) HAND;  Surgeon: Tami Ribas, MD;  Location: [facility] OR;  Service: Orthopedics;  Laterality: Right;  . Appendectomy      History reviewed. No pertinent family history. Social History:  reports that he has been smoking Cigarettes.  He has a 2.5 pack-year smoking history. He has never used smokeless tobacco. He reports that he drinks alcohol. He reports that he does not use illicit drugs.  Allergies:  Allergies  Allergen Reactions  . Penicillins Hives    Medications Prior to Admission  Medication Sig Dispense Refill  . sulfamethoxazole-trimethoprim (BACTRIM DS) 800-160 MG per tablet Take 1 tablet by mouth 2 (two) times daily.  14 tablet  0    Results for orders placed during the hospital encounter of 08/29/13 (from the past 48 hour(s))  POCT HEMOGLOBIN-HEMACUE     Status: Abnormal   Collection Time    08/29/13 11:46 AM      Result Value Ref Range   Hemoglobin 18.0 (*) 13.0 - 17.0 g/dL    No results found.   A comprehensive review of systems was negative except for: Eyes: positive for contacts/glasses  Blood pressure 116/77, pulse 85, temperature 98.6 F (37 C), temperature source Oral, resp. rate 20, height 5\' 11"  (1.803 m), weight 109.317 kg (241 lb), SpO2 98.00%.  General appearance: alert, cooperative and appears stated age Head: Normocephalic, without obvious abnormality,  atraumatic Neck: supple, symmetrical, trachea midline Resp: clear to auscultation bilaterally Cardio: regular rate and rhythm GI: non tender Extremities: intact sensation and capillary refill all digits.  +epl/fpl/io.  wounds healed.  no swelling, erythema, ecchymosis.  visible instability of ring metacarpal.   Pulses: 2+ and symmetric Skin: Skin color, texture, turgor normal. No rashes or lesions Neurologic: Grossly normal Incision/Wound: healed  Assessment/Plan Right ring finger metacarpal non union.  Non operative and operative treatment options were discussed with the patient and patient wishes to proceed with operative treatment. Plan ORIF with distal radius bone graft.  Risks, benefits, and alternatives of surgery were discussed and the patient agrees with the plan of care.   Tami Ribas 08/29/2013, 12:44 PM

## 2013-08-29 NOTE — Op Note (Signed)
[id number] 

## 2013-08-29 NOTE — Progress Notes (Signed)
Assisted Dr. Joslin with right, ultrasound guided, supraclavicular block. Side rails up, monitors on throughout procedure. See vital signs in flow sheet. Tolerated Procedure well. 

## 2013-08-29 NOTE — Discharge Instructions (Addendum)

## 2013-08-29 NOTE — Brief Op Note (Signed)
08/29/2013  3:08 PM  PATIENT:  Randy Nichols  23 y.o. male  PRE-OPERATIVE DIAGNOSIS:  RIGHT RING FINGER METACARPAL FRACTURE NONUNION  POST-OPERATIVE DIAGNOSIS:  RIGHT RING FINGER METACARPAL FRACTURE NONUNION  PROCEDURE:  Procedure(s): OPEN REDUCTION INTERNAL FIXATION (ORIF) RIGHT RING FINGER METACARPAL WITH DISTAL RADIUS BONE GRAFT (Right)  SURGEON:  Surgeon(s) and Role:    * Tami RibasKevin R Lowen Mansouri, MD - Primary    * Nicki ReaperGary R Joan Avetisyan, MD - Assisting  PHYSICIAN ASSISTANT:   ASSISTANTS: Cindee SaltGary Corean Yoshimura, MD   ANESTHESIA:   regional and general  EBL:  Total I/O In: 1000 [I.V.:1000] Out: -   BLOOD ADMINISTERED:none  DRAINS: none   LOCAL MEDICATIONS USED:  NONE  SPECIMEN:  No Specimen  DISPOSITION OF SPECIMEN:  N/A  COUNTS:  YES  TOURNIQUET:   Total Tourniquet Time Documented: Upper Arm (Right) - 91 minutes Total: Upper Arm (Right) - 91 minutes   DICTATION: .Other Dictation: Dictation Number [id number]  PLAN OF CARE: Discharge to home after PACU  PATIENT DISPOSITION:  PACU - hemodynamically stable.

## 2013-08-29 NOTE — Op Note (Signed)
Intra-operative fluoroscopic images in the AP, lateral, and oblique views were taken and evaluated by myself.  Reduction and hardware placement were confirmed.  There was no intraarticular penetration of permanent hardware.  

## 2013-08-29 NOTE — Transfer of Care (Signed)
Immediate Anesthesia Transfer of Care Note  Patient: Randy Nichols  Procedure(s) Performed: Procedure(s): OPEN REDUCTION INTERNAL FIXATION (ORIF) RIGHT RING FINGER METACARPAL WITH DISTAL RADIUS BONE GRAFT (Right)  Patient Location: PACU  Anesthesia Type:GA combined with regional for post-op pain  Level of Consciousness: sedated and patient cooperative  Airway & Oxygen Therapy: Patient Spontanous Breathing and Patient connected to face mask oxygen  Post-op Assessment: Report given to PACU RN and Post -op Vital signs reviewed and stable  Post vital signs: Reviewed and stable  Complications: No apparent anesthesia complications

## 2013-08-29 NOTE — Anesthesia Preprocedure Evaluation (Signed)
Anesthesia Evaluation  Patient identified by MRN, date of birth, ID band Patient awake    Reviewed: Allergy & Precautions, H&P , NPO status , Patient's Chart, lab work & pertinent test results  Airway Mallampati: II TM Distance: >3 FB Neck ROM: Full    Dental  (+) Teeth Intact, Dental Advisory Given   Pulmonary Current Smoker,  breath sounds clear to auscultation        Cardiovascular Rhythm:Regular Rate:Normal     Neuro/Psych    GI/Hepatic   Endo/Other    Renal/GU      Musculoskeletal   Abdominal   Peds  Hematology   Anesthesia Other Findings   Reproductive/Obstetrics                           Anesthesia Physical Anesthesia Plan  ASA: II  Anesthesia Plan: General   Post-op Pain Management:    Induction: Intravenous  Airway Management Planned: LMA  Additional Equipment:   Intra-op Plan:   Post-operative Plan:   Informed Consent: I have reviewed the patients History and Physical, chart, labs and discussed the procedure including the risks, benefits and alternatives for the proposed anesthesia with the patient or authorized representative who has indicated his/her understanding and acceptance.     Plan Discussed with: CRNA and Anesthesiologist  Anesthesia Plan Comments: (Nonunion R. 2and metacarpal Smoker  Plan GA with supraclavicular block)        Anesthesia Quick Evaluation

## 2013-08-30 ENCOUNTER — Encounter (HOSPITAL_BASED_OUTPATIENT_CLINIC_OR_DEPARTMENT_OTHER): Payer: Self-pay | Admitting: Orthopedic Surgery

## 2013-08-30 NOTE — Op Note (Signed)
NAME:  Sharlene DoryBOLDEN, Bralon                ACCOUNT NO.:  [id number]  MEDICAL RECORD NO.:  [id number]  LOCATION:                                 FACILITY:  PHYSICIAN:  Betha LoaKevin Elin Seats, MD        DATE OF BIRTH:  01/04/1991  DATE OF PROCEDURE:  08/29/2013 DATE OF DISCHARGE:                              OPERATIVE REPORT   PREOPERATIVE DIAGNOSIS:  Right ring finger metacarpal nonunion.  POSTOPERATIVE DIAGNOSIS:  Right ring finger metacarpal nonunion.  PROCEDURE:   1. Open reduction and internal fixation right ring finger metacarpal 2. Takedown of nonunion right ring finger metacarpal 3. Bone grafting from distal radius  SURGEON:  Betha LoaKevin Porshea Janowski, MD  ASSISTANT:  Cindee SaltGary Jacinto Keil, M.D.  ANESTHESIA:  General with regional.  IV FLUIDS:  Per anesthesia flow sheet.  ESTIMATED BLOOD LOSS:  Minimal.  COMPLICATIONS:  None.  SPECIMENS:  None.  TOURNIQUET TIME:  91 minutes.  DISPOSITION:  Stable to PACU.  INDICATIONS:  Mr. Lanier PrudeBolden is a 23 year old male who 3 months ago suffered injury to his right hand where he has an open ring finger metacarpal fracture.  The fracture was I&D'd and the fracture percutaneously pinned at that time.  He has healed half of the fracture, but the proximal portion of the fracture went onto nonunion.  I discussed with Mr. Lanier PrudeBolden on the nature of his condition.  I recommended open reduction and internal fixation of the fracture with bone grafting from distal radius.  Risks, benefits, and alternatives of surgery were discussed including risk of blood loss, infection, damage to nerves, vessels, tendons, ligaments, bone; failure of surgery; need for additional surgery, complications with wound healing, continued pain, nonunion, malunion, and stiffness. He voiced understanding of these risks and elected to proceed.  OPERATIVE COURSE:  After being identified preoperatively by myself, the patient and I agreed upon procedure and site of procedure.  Surgical site was marked.  The  risks, benefits, and alternatives of surgery were reviewed and he wished to proceed.  Surgical consent had been signed. He was given IV antibiotics as preoperative antibiotic prophylaxis. Regional block was performed by anesthesia in preoperative holding room. He was transferred to the operating room and placed on the operating room table in a supine position with the right upper extremity on arm board.  General anesthesia was induced by anesthesiologist.  Right upper extremity was prepped and draped in normal sterile orthopedic fashion. Surgical pause was performed between surgeons, anesthesia, operating staff, and all were in agreement as to the patient, procedure, and site of procedure.  Tourniquet at the proximal aspect of the extremity was inflated to 250 mmHg after exsanguination of the limb with an Esmarch bandage.  Incision was made over the dorsum of the ring finger metacarpal and carried into subcutaneous tissues by spreading technique. There was scar formation.  The extensor tendons were retracted.  A branch from the ring finger extensor tendon to the small finger was transected and repaired at the end of the case.  The periosteum was incised sharply.  There was nonunion at the proximal aspect of the fracture of the ring finger metacarpal.  The periosteum was elevated from the bone.  The distal portion had healed.  Fibrous union was cleared out from the fracture site.  The fracture was able to be acceptably brought out to length.  An incision was made over Lister's tubercle.  This was carried into subcutaneous tissues by spreading technique.  Bipolar electrocautery was used to obtain hemostasis.  The periosteum was incised over Lister's tubercle and a 0.035-inch K-wire was used to create a window on the dorsal cortex.  This was elevated with the osteotome.  Bone graft was taken from the medullary canal of the distal radius.  Attention was returned to the ring finger metacarpal.   The wound was copiously irrigated with sterile saline by bulb syringe.  A Y-shaped plate from the ALPS set was selected.  The plate was shortened to fit.  It was secured to the bone using guide pins.  C-arm was used in AP, lateral, and oblique projections to ensure appropriate reduction and position of hardware, which was the case.  The wrist was placed through a tenodesis, and there was no scissoring of the ring finger.  It did come under the long finger very slightly.  The holes in the plate were then filled with standard AO drilling and measuring technique.  All nonlocking screws were used.  Good purchase was obtained.  Once the plate had been secured to the bone, the bone graft was packed into the fracture site.  C-arm was used in AP, lateral, and oblique projections to ensure appropriate reduction and position of hardware, which was the case.  The incision at the distal radius was irrigated with sterile saline.  The periosteum was repaired over the Lister's tubercle with 4-0 Vicryl suture and skin closed with 4-0 nylon in a horizontal mattress fashion.  The periosteum was repaired back over the top of the plate again using a 4-0 Vicryl suture.  A 4-0 Mersilene was used to repair the tendon release that was performed to the small finger.  The skin was closed with 4-0 nylon in a horizontal mattress fashion.  Wounds were dressed with sterile Xeroform, 4x4s, and wrapped with a Kerlix bandage.  Volar and dorsal slab splint including the long, ring, and small fingers was placed with the MPs flexed and IPs extended. This was wrapped with Kerlix and Ace bandage.  Tourniquet was deflated at 91 minutes.  Fingertips were pink with brisk capillary refill after deflation of tourniquet.  Operative drapes were broken down and the patient was awoken from anesthesia safely.  He was transferred back to the stretcher and taken to PACU in stable condition.  I will give Percocet 5/325, one to two p.o.  q.6 hours p.r.n. pain, dispensed #40.     Betha Loa, MD     KK/MEDQ  D:  08/29/2013  T:  08/30/2013  Job:  [id number]

## 2013-08-31 NOTE — Anesthesia Postprocedure Evaluation (Signed)
  Anesthesia Post-op Note  Patient: Randy Nichols  Procedure(s) Performed: Procedure(s): OPEN REDUCTION INTERNAL FIXATION (ORIF) RIGHT RING FINGER METACARPAL WITH DISTAL RADIUS BONE GRAFT (Right)  Patient Location: PACU  Anesthesia Type:General and Regional  Level of Consciousness: awake and alert   Airway and Oxygen Therapy: Patient Spontanous Breathing  Post-op Pain: none  Post-op Assessment: Post-op Vital signs reviewed  Post-op Vital Signs: Reviewed and stable  Last Vitals:  Filed Vitals:   08/29/13 1545  BP: 126/79  Pulse: 101  Temp:   Resp: 20    Complications: No apparent anesthesia complications

## 2014-12-31 IMAGING — CR DG HAND COMPLETE 3+V*R*
3 series · 3 of 3 positions shown · non-contrast
Comparison: None.

CLINICAL DATA: Puncture wound to hand.

EXAM:
RIGHT HAND - COMPLETE 3+ VIEW

[x hand pa right]
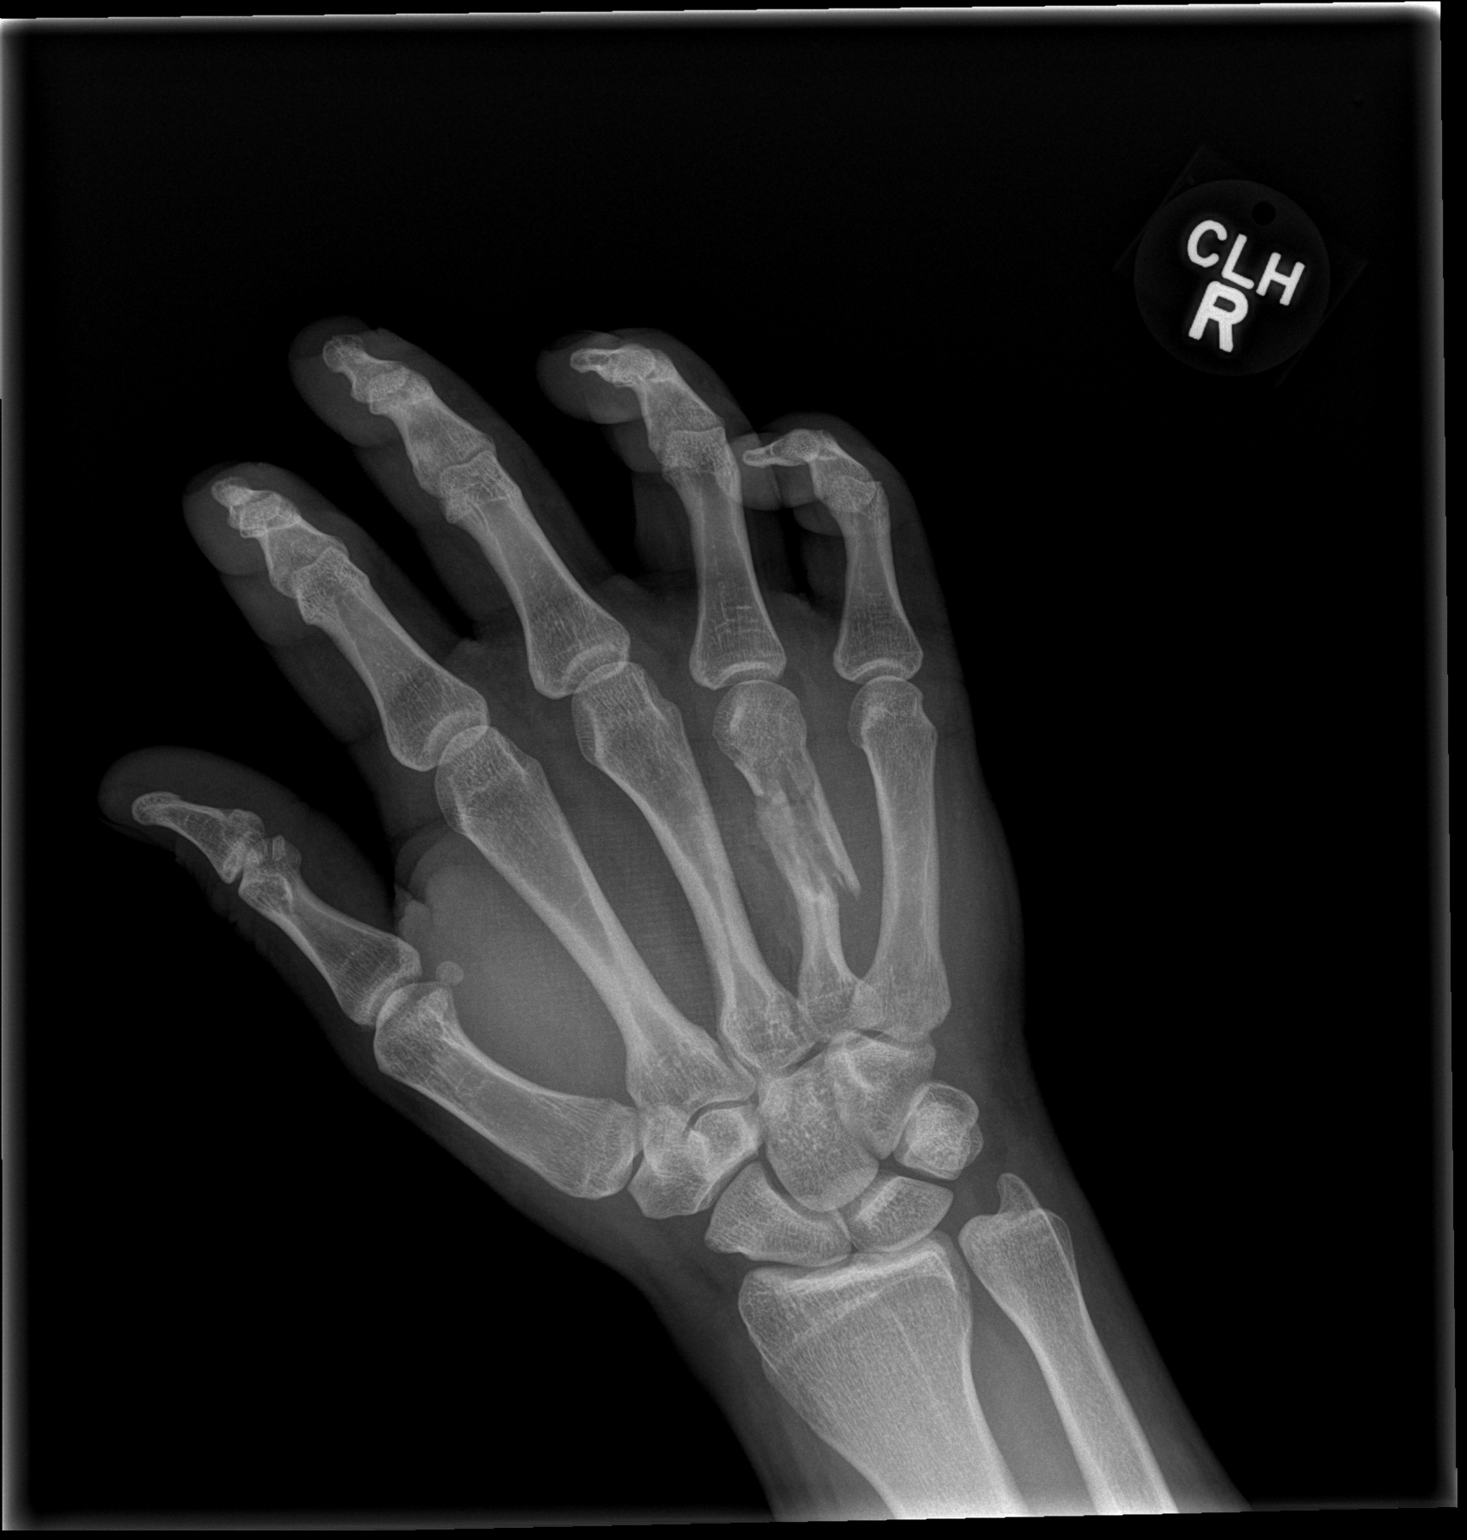

[x hand obl right]
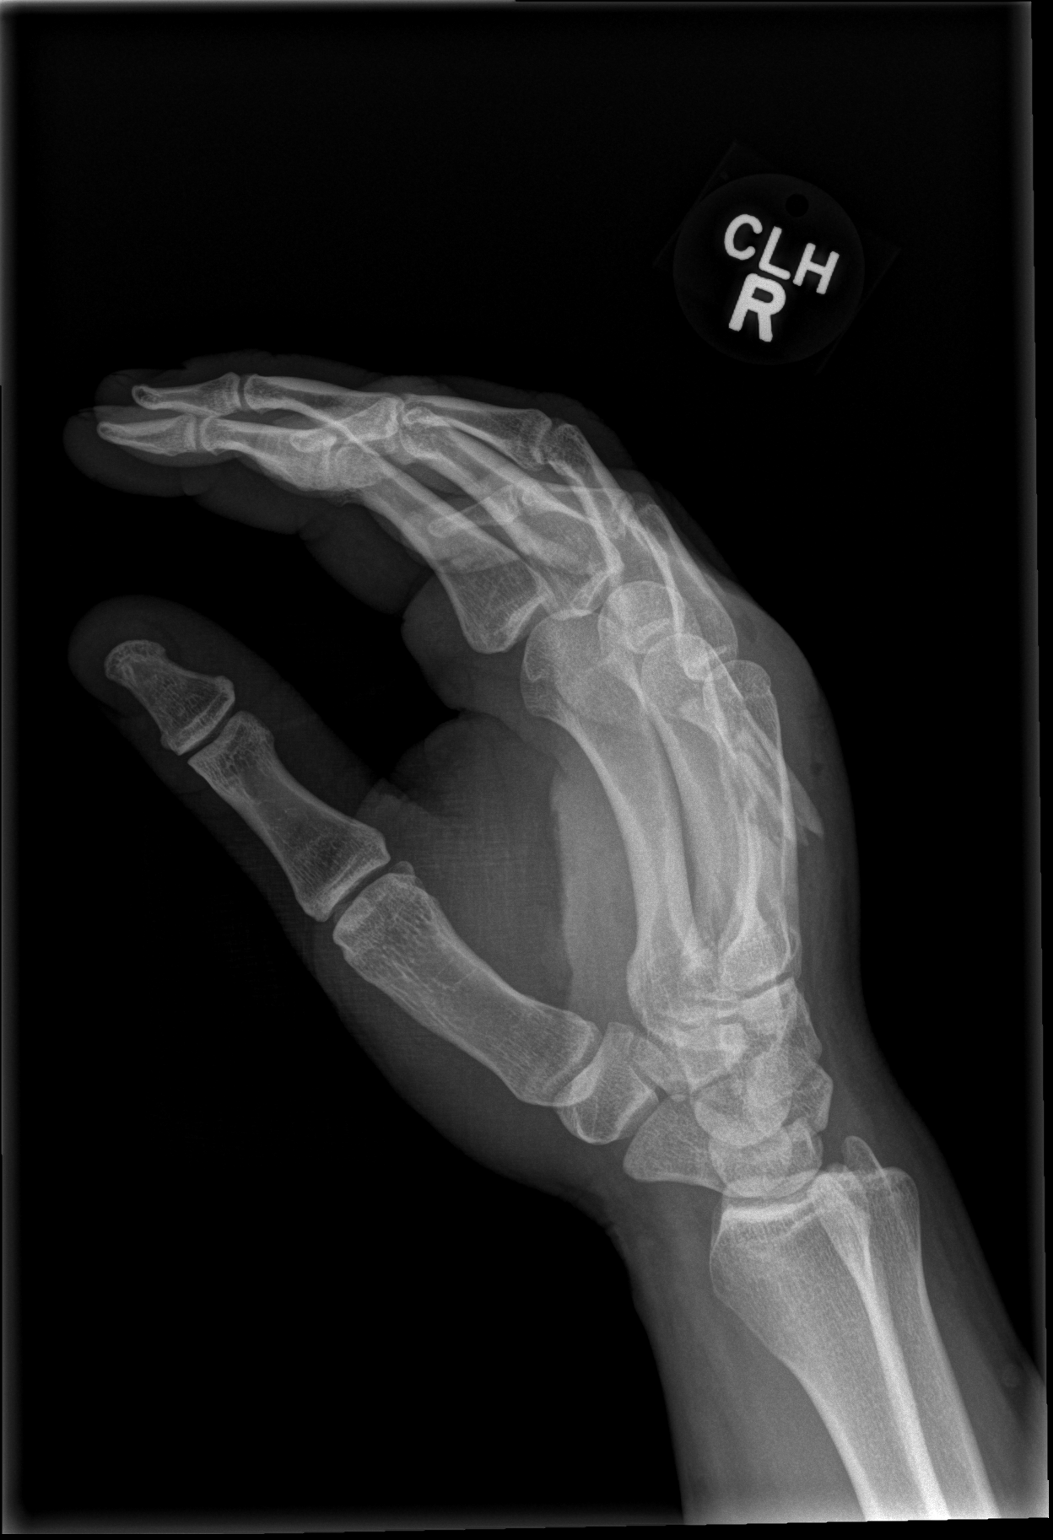

[x hand lat right]
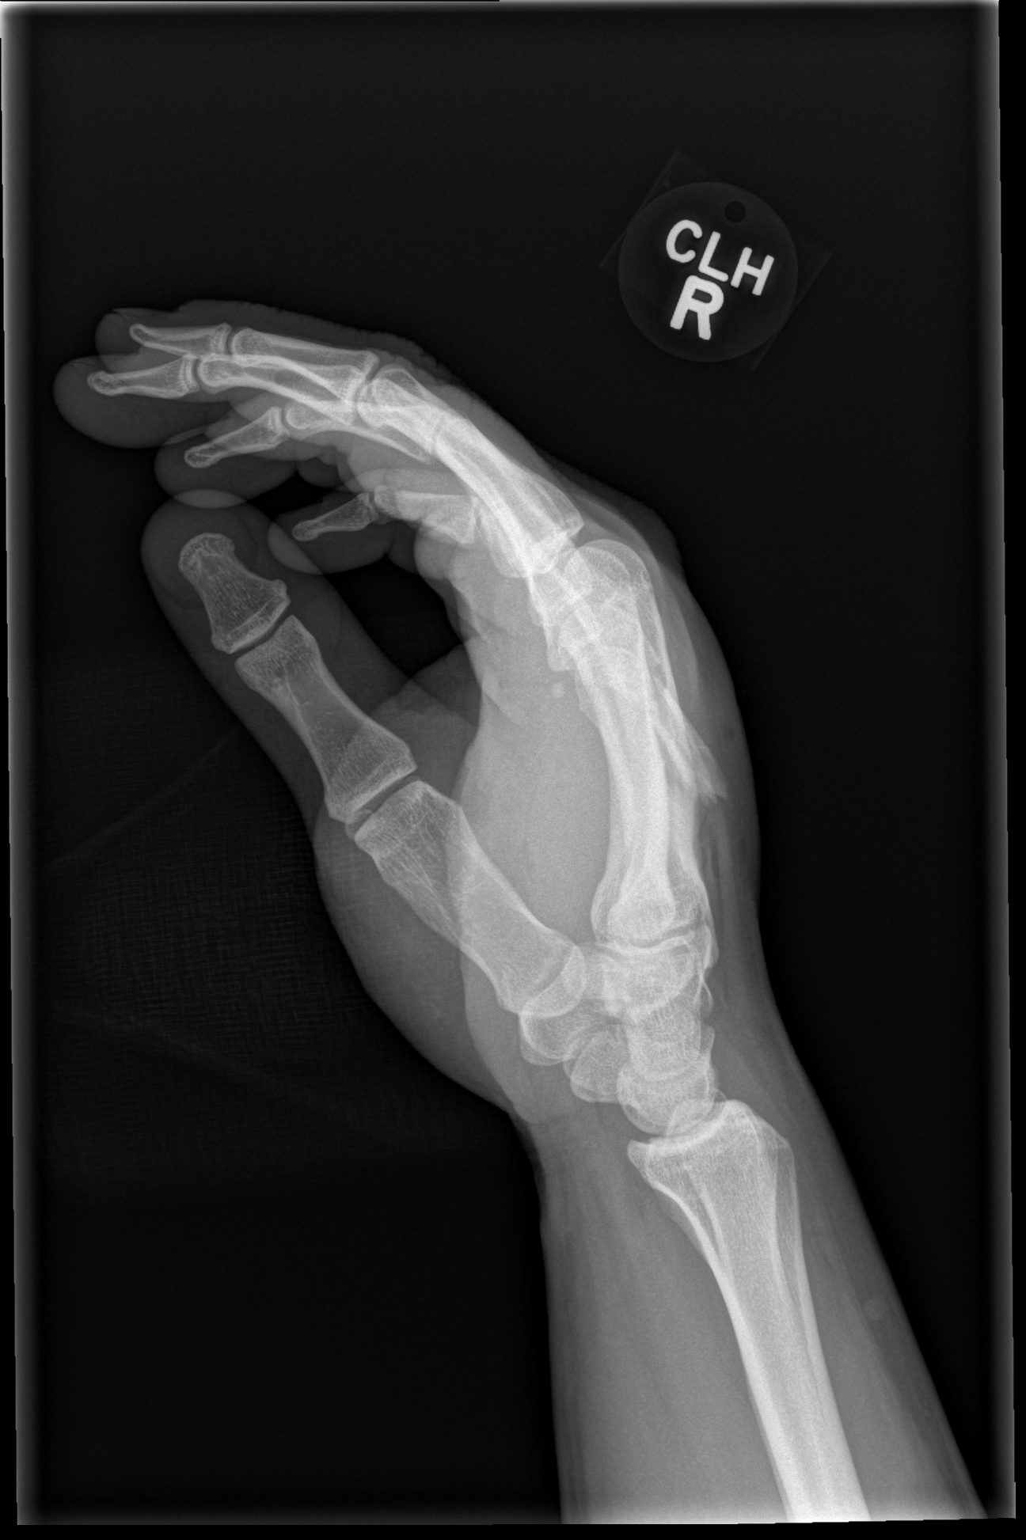

[3 of 3 positions shown; findings below may reference images not displayed]

FINDINGS: There is a comminuted fracture of the shaft of the fourth
metacarpal, which demonstrates dorsal displacement and palmar
angulation. There is overlying soft tissue swelling with a small
amount of soft tissue emphysema. There is no dislocation. Other
bones of the hand appear intact. No radiopaque foreign body is seen.
IMPRESSION: Open, comminuted, mildly displaced and angulated fracture of the
fourth metacarpal.

## 2018-02-15 ENCOUNTER — Encounter (HOSPITAL_COMMUNITY): Payer: Self-pay | Admitting: Emergency Medicine

## 2018-02-15 ENCOUNTER — Inpatient Hospital Stay (HOSPITAL_COMMUNITY)
Admission: EM | Admit: 2018-02-15 | Discharge: 2018-02-17 | DRG: 060 | Disposition: A | Discharge status: Home / Self Care | Payer: BLUE CROSS/BLUE SHIELD | Attending: Internal Medicine | Admitting: Internal Medicine

## 2018-02-15 ENCOUNTER — Emergency Department (HOSPITAL_COMMUNITY): Payer: BLUE CROSS/BLUE SHIELD

## 2018-02-15 ENCOUNTER — Other Ambulatory Visit: Payer: Self-pay

## 2018-02-15 DIAGNOSIS — Z833 Family history of diabetes mellitus: Secondary | ICD-10-CM | POA: Diagnosis not present

## 2018-02-15 DIAGNOSIS — F17210 Nicotine dependence, cigarettes, uncomplicated: Secondary | ICD-10-CM | POA: Diagnosis present

## 2018-02-15 DIAGNOSIS — Z880 Allergy status to penicillin: Secondary | ICD-10-CM

## 2018-02-15 DIAGNOSIS — G35 Multiple sclerosis: Secondary | ICD-10-CM | POA: Diagnosis present

## 2018-02-15 DIAGNOSIS — K219 Gastro-esophageal reflux disease without esophagitis: Secondary | ICD-10-CM | POA: Diagnosis present

## 2018-02-15 DIAGNOSIS — R471 Dysarthria and anarthria: Secondary | ICD-10-CM | POA: Diagnosis present

## 2018-02-15 DIAGNOSIS — Z720 Tobacco use: Secondary | ICD-10-CM | POA: Diagnosis present

## 2018-02-15 HISTORY — DX: Tobacco use: Z72.0

## 2018-02-15 LAB — DIFFERENTIAL
ABS IMMATURE GRANULOCYTES: 0.04 10*3/uL (ref 0.00–0.07)
Basophils Absolute: 0.1 10*3/uL (ref 0.0–0.1)
Basophils Relative: 1 %
EOS ABS: 0.6 10*3/uL — AB (ref 0.0–0.5)
Eosinophils Relative: 5 %
IMMATURE GRANULOCYTES: 0 %
LYMPHS ABS: 2.6 10*3/uL (ref 0.7–4.0)
Lymphocytes Relative: 25 %
Monocytes Absolute: 1 10*3/uL (ref 0.1–1.0)
Monocytes Relative: 10 %
NEUTROS ABS: 6.1 10*3/uL (ref 1.7–7.7)
Neutrophils Relative %: 59 %

## 2018-02-15 LAB — COMPREHENSIVE METABOLIC PANEL
ALT: 37 U/L (ref 0–44)
AST: 29 U/L (ref 15–41)
Albumin: 4.4 g/dL (ref 3.5–5.0)
Alkaline Phosphatase: 58 U/L (ref 38–126)
Anion gap: 8 (ref 5–15)
BUN: 11 mg/dL (ref 6–20)
CHLORIDE: 109 mmol/L (ref 98–111)
CO2: 22 mmol/L (ref 22–32)
Calcium: 9.3 mg/dL (ref 8.9–10.3)
Creatinine, Ser: 0.9 mg/dL (ref 0.61–1.24)
GFR calc Af Amer: >60 mL/min (ref >60)
GFR calc non Af Amer: >60 mL/min (ref >60)
Glucose, Bld: 95 mg/dL (ref 70–99)
POTASSIUM: 4 mmol/L (ref 3.5–5.1)
Sodium: 139 mmol/L (ref 135–145)
Total Bilirubin: 0.5 mg/dL (ref 0.3–1.2)
Total Protein: 7.3 g/dL (ref 6.5–8.1)

## 2018-02-15 LAB — CBC
HEMATOCRIT: 47 % (ref 39.0–52.0)
HEMOGLOBIN: 15 g/dL (ref 13.0–17.0)
MCH: 30.2 pg (ref 26.0–34.0)
MCHC: 31.9 g/dL (ref 30.0–36.0)
MCV: 94.8 fL (ref 80.0–100.0)
Platelets: 296 10*3/uL (ref 150–400)
RBC: 4.96 MIL/uL (ref 4.22–5.81)
RDW: 12.2 % (ref 11.5–15.5)
WBC: 10.3 10*3/uL (ref 4.0–10.5)
nRBC: 0 % (ref 0.0–0.2)

## 2018-02-15 MED ORDER — ONDANSETRON HCL 4 MG PO TABS: 4.0000 mg | ORAL_TABLET | Freq: Four times a day (QID) | ORAL | Status: DC | PRN

## 2018-02-15 MED ORDER — ACETAMINOPHEN 325 MG PO TABS
650.0000 mg | ORAL_TABLET | Freq: Four times a day (QID) | ORAL | Status: DC | PRN
  Administered 2018-02-16: 650 mg via ORAL
  Filled 2018-02-15: qty 2

## 2018-02-15 MED ORDER — PANTOPRAZOLE SODIUM 40 MG PO TBEC
40.0000 mg | DELAYED_RELEASE_TABLET | Freq: Every day | ORAL | Status: DC
  Administered 2018-02-15 – 2018-02-16 (×2): 40 mg via ORAL
  Filled 2018-02-15 (×2): qty 1

## 2018-02-15 MED ORDER — SODIUM CHLORIDE 0.9 % IV SOLN
1000.0000 mg | INTRAVENOUS | Status: DC
  Administered 2018-02-16 – 2018-02-17 (×2): 1000 mg via INTRAVENOUS
  Filled 2018-02-15 (×2): qty 8

## 2018-02-15 MED ORDER — HYDROXYZINE HCL 25 MG PO TABS: 50.0000 mg | ORAL_TABLET | Freq: Four times a day (QID) | ORAL | Status: DC | PRN

## 2018-02-15 MED ORDER — ACETAMINOPHEN 650 MG RE SUPP: 650.0000 mg | Freq: Four times a day (QID) | RECTAL | Status: DC | PRN

## 2018-02-15 MED ORDER — NICOTINE 21 MG/24HR TD PT24
21.0000 mg | MEDICATED_PATCH | Freq: Every day | TRANSDERMAL | Status: DC | PRN
  Administered 2018-02-15 – 2018-02-16 (×2): 21 mg via TRANSDERMAL
  Filled 2018-02-15 (×2): qty 1

## 2018-02-15 MED ORDER — HEPARIN SODIUM (PORCINE) 5000 UNIT/ML IJ SOLN
5000.0000 [IU] | Freq: Three times a day (TID) | INTRAMUSCULAR | Status: DC
  Filled 2018-02-15: qty 1

## 2018-02-15 MED ORDER — ONDANSETRON HCL 4 MG/2ML IJ SOLN: 4.0000 mg | Freq: Four times a day (QID) | INTRAMUSCULAR | Status: DC | PRN

## 2018-02-15 MED ORDER — METHYLPREDNISOLONE SODIUM SUCC 1000 MG IJ SOLR
1000.0000 mg | Freq: Once | INTRAMUSCULAR | Status: AC
  Administered 2018-02-15: 1000 mg via INTRAVENOUS
  Filled 2018-02-15: qty 8

## 2018-02-15 NOTE — ED Provider Notes (Signed)
[facility] EMERGENCY DEPARTMENT Provider Note   CSN: [id number] Arrival date & time: 02/15/18  0865     History   Chief Complaint Chief Complaint  Patient presents with  . Aphasia    HPI Randy Nichols is a 27 y.o. male.  The history is provided by the patient. No language interpreter was used.  Weakness  Primary symptoms include focal weakness, loss of balance, speech change. This is a new problem. Episode onset: 3 weeks. The problem has been gradually worsening. There was bilateral, right upper extremity, left upper extremity, left lower extremity and right lower extremity focality noted. There has been no fever. Pertinent negatives include no shortness of breath. There were no medications administered prior to arrival. Associated medical issues do not include trauma.   Pt reports he was seen in [place] a week ago and had a ct scan.  Pt reports he was told his scan was normal.  Pt reports his parents are worried that he has MS.  Past Medical History:  Diagnosis Date  . GERD (gastroesophageal reflux disease)    TUMS as needed  . 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   Past Surgical History:  Procedure Laterality Date  . APPENDECTOMY    . OPEN REDUCTION INTERNAL FIXATION (ORIF) HAND Right 06/04/2013   Procedure: OPEN REDUCTION INTERNAL FIXATION (ORIF) HAND;  Surgeon: Tami Ribas, MD;  Location: [facility] OR;  Service: Orthopedics;  Laterality: Right;  . OPEN REDUCTION INTERNAL FIXATION (ORIF) METACARPAL Right 08/29/2013   Procedure: OPEN REDUCTION INTERNAL FIXATION (ORIF) RIGHT RING FINGER METACARPAL WITH DISTAL RADIUS BONE GRAFT;  Surgeon: Tami Ribas, MD;  Location: [facility];  Service: Orthopedics;  Laterality: Right;        Home Medications    Prior to Admission medications   Medication Sig Start Date End Date Taking? Authorizing Provider    oxyCODONE-acetaminophen (PERCOCET) 5-325 MG per tablet 1-2 tabs po q6 hours prn pain 08/29/13   Betha Loa, MD  sulfamethoxazole-trimethoprim (BACTRIM DS) 800-160 MG per tablet Take 1 tablet by mouth 2 (two) times daily. 06/04/13   Betha Loa, MD    Family History No family history on file.  Social History Social History   Tobacco Use  . Smoking status: Current Every Day Smoker    Packs/day: 0.50    Years: 5.00    Pack years: 2.50    Types: Cigarettes  . Smokeless tobacco: Never Used  Substance Use Topics  . Alcohol use: Yes    Comment: socially  . Drug use: No     Allergies   Penicillins   Review of Systems Review of Systems  Respiratory: Negative for shortness of breath.   Neurological: Positive for speech change, focal weakness, weakness and loss of balance.  All other systems reviewed and are negative.    Physical Exam Updated Vital Signs BP (!) 150/92 (BP Location: Right Arm)   Pulse 99   Temp 99.1 F (37.3 C) (Oral)   Resp 17   SpO2 98%   Physical Exam  Constitutional: He appears well-developed and well-nourished.  HENT:  Head: Normocephalic and atraumatic.  Right Ear: External ear normal.  Left Ear: External ear normal.  Mouth/Throat: Oropharynx is clear and moist.  Eyes: Pupils are equal, round, and reactive to light. Conjunctivae and EOM are normal.  Neck: Neck supple.  Cardiovascular: Normal rate and regular rhythm.  No  murmur heard. Pulmonary/Chest: Effort normal and breath sounds normal. No respiratory distress.  Abdominal: Soft. There is no tenderness.  Musculoskeletal: He exhibits no edema.  Neurological: He is alert.  Skin: Skin is warm and dry.  Psychiatric: He has a normal mood and affect.  Nursing note and vitals reviewed.    ED Treatments / Results  Labs (all labs ordered are listed, but only abnormal results are displayed) Labs Reviewed  DIFFERENTIAL - Abnormal; Notable for the following components:      Result Value    Eosinophils Absolute 0.6 (*)    All other components within normal limits  CBC  COMPREHENSIVE METABOLIC PANEL    EKG None  Radiology Mr Brain Wo Contrast  Result Date: 02/15/2018 CLINICAL DATA:  Slurred speech and loss of coordination for 3 weeks. EXAM: MRI HEAD WITHOUT CONTRAST TECHNIQUE: Multiplanar, multiecho pulse sequences of the brain and surrounding structures were obtained without intravenous contrast. COMPARISON:  02/07/2018 head CT FINDINGS: Brain: There is no evidence of acute infarct, intracranial hemorrhage, mass, midline shift, or extra-axial fluid collection. There are greater than 30 T2/FLAIR hyperintense lesions throughout the cerebral white matter with involvement of the periventricular, deep, and juxtacortical white matter. Multiple lesions are oriented perpendicularly to the lateral ventricles. There is involvement of the corpus callosum, brainstem and cerebellum. Some lesions demonstrate T2 shine through on diffusion imaging without true restricted diffusion. The ventricles and sulci are normal. Vascular: Major intracranial vascular flow voids are preserved. Skull and upper cervical spine: Unremarkable bone marrow signal. Possible FLAIR hyperintense lesion in the ventral spinal cord at C2. Sinuses/Orbits: Unremarkable orbits. Mild left maxillary sinus mucosal thickening. Clear mastoid air cells. Other: None. IMPRESSION: 1. Numerous lesions throughout the cerebral white matter and posterior fossa with an appearance typical of multiple sclerosis. No restricted diffusion to indicate active demyelination. 2. Possible spinal cord involvement at C2. Electronically Signed   By: Sebastian Ache M.D.   On: 02/15/2018 11:57    Procedures Procedures (including critical care time)  Medications Ordered in ED Medications - No data to display   Initial Impression / Assessment and Plan / ED Course  I have reviewed the triage vital signs and the nursing notes.  Pertinent labs & imaging  results that were available during my care of the patient were reviewed by me and considered in my medical decision making (see chart for details).     MDM  Labs reviewed, MRi shows numerus lesions in white matter typical of MS.  I discussed Dr. Laurence Slate who advised admission for IV solumedrol 1 gram x 5 days.   Final Clinical Impressions(s) / ED Diagnoses   Final diagnoses:  MS (multiple sclerosis) [facility])    ED Discharge Orders    None     Consult to unassigned for addmiison.   Dr. Robb Matar will see for admission   Osie Cheeks 02/15/18 1247    Wynetta Fines, MD 02/15/18 [phone]

## 2018-02-15 NOTE — H&P (Signed)
History and Physical    Randy Nichols MPN:[id number] DOB: Jan 23, 1991 DOA: 02/15/2018  PCP: Patient, No Pcp Per   Patient coming from: Home.  I have personally briefly reviewed patient's old medical records in [facility]  Chief Complaint: Slurred speech.  HPI: Randy Nichols is a 27 y.o. male with medical history significant of GERD, metacarpal fracture, tobacco use who is coming to the emergency department with complaints of slurred speech for the past 3 weeks associated with loss of coordination and stating that he thinks he has multiple sclerosis.  He was recently at [facility] where they did a CT head and was told that there was no stroke.  However, the slurred speech and decreased coordination has continued.  He denies fever, chills, sore throat, wheezing, hemoptysis, chest pain, palpitations, dizziness, diaphoresis, pitting edema of the lower extremities, PND or orthopnea.  Denies abdominal pain, nausea, emesis, diarrhea, constipation, melena or hematochezia.  No dysuria, frequency or hematuria.  No polyuria, polydipsia, polyphagia.  Denies heat or cold intolerance.  ED Course: Initial vital signs temperature 99.1 F, pulse 99, respirations 17, blood pressure 150/92 mmHg O2 sat 98% on room air.  The patient received 1000 mg of Solu-Medrol IVPB.  His CBC was normal with a white count of 10.3, hemoglobin 15 and platelets 296.  CMP was normal.   Imaging: His MRI of brain showed numerous lesions throughout the cerebral white matter and posterior fossa with an appearance typical of multiple sclerosis.  No restricted diffusion to indicate active demyelination.  Possible spinal cord involvement at C2.  Please see images and full radiology report for further detail.  Review of Systems: As per HPI otherwise 10 point review of systems negative.  Past Medical History:  Diagnosis Date  . GERD (gastroesophageal reflux disease)    TUMS as needed  . 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 Tobacco use     Past Surgical History:  Procedure Laterality Date  . APPENDECTOMY    . OPEN REDUCTION INTERNAL FIXATION (ORIF) HAND Right 06/04/2013   Procedure: OPEN REDUCTION INTERNAL FIXATION (ORIF) HAND;  Surgeon: Tami Ribas, MD;  Location: [facility] OR;  Service: Orthopedics;  Laterality: Right;  . OPEN REDUCTION INTERNAL FIXATION (ORIF) METACARPAL Right 08/29/2013   Procedure: OPEN REDUCTION INTERNAL FIXATION (ORIF) RIGHT RING FINGER METACARPAL WITH DISTAL RADIUS BONE GRAFT;  Surgeon: Tami Ribas, MD;  Location: [facility];  Service: Orthopedics;  Laterality: Right;     reports that he has been smoking cigarettes. He has a 2.50 pack-year smoking history. He has never used smokeless tobacco. He reports that he drinks alcohol. He reports that he does not use drugs.  Allergies  Allergen Reactions  . Penicillins Hives    Family History  Problem Relation Age of Onset  . Diabetes Mother   . Diabetes Maternal Uncle   . Diabetes Maternal Uncle   . Diabetes Maternal Aunt     Prior to Admission medications   Medication Sig Start Date End Date Taking? Authorizing Provider  oxyCODONE-acetaminophen (PERCOCET) 5-325 MG per tablet 1-2 tabs po q6 hours prn pain 08/29/13   Betha Loa, MD  sulfamethoxazole-trimethoprim (BACTRIM DS) 800-160 MG per tablet Take 1 tablet by mouth 2 (two) times daily. 06/04/13   Betha Loa, MD    Physical Exam: Vitals:   02/15/18 0840 02/15/18 1321  BP: (!) 150/92 140/88  Pulse: 99 78  Resp: 17 18  Temp: 99.1 F (37.3 C) 98.5  F (36.9 C)  TempSrc: Oral Oral  SpO2: 98% 98%    Constitutional: NAD, calm, comfortable Eyes: PERRL, lids and conjunctivae normal ENMT: Mucous membranes are moist. Posterior pharynx clear of any exudate or lesions. Neck: Normal, supple, no masses, no thyromegaly Respiratory: clear to auscultation bilaterally, no wheezing, no crackles. Normal respiratory effort. No accessory muscle use.  Cardiovascular:  Regular rate and rhythm, no murmurs / rubs / gallops. No extremity edema. 2+ pedal pulses. No carotid bruits.  Abdomen: Obese, soft, no tenderness, no masses palpated. No hepatosplenomegaly. Bowel sounds positive.  Musculoskeletal: no clubbing / cyanosis. No joint deformity upper and lower extremities. Good ROM, no contractures. Normal muscle tone.  Skin: no rashes, lesions, ulcers. No induration on limited dermatological examination. Neurologic:  CN 2-12 grossly intact. Sensation intact, DTR normal. Positive intention tremor.  Strength 5/5 in all 4.  Psychiatric: Normal judgment and insight. Alert and oriented x 3. Normal mood.   Labs on Admission: I have personally reviewed following labs and imaging studies  CBC: Recent Labs  Lab 02/15/18 0907  WBC 10.3  NEUTROABS 6.1  HGB 15.0  HCT 47.0  MCV 94.8  PLT 296   Basic Metabolic Panel: Recent Labs  Lab 02/15/18 0907  NA 139  K 4.0  CL 109  CO2 22  GLUCOSE 95  BUN 11  CREATININE 0.90  CALCIUM 9.3   GFR: CrCl cannot be calculated (Unknown ideal weight.). Liver Function Tests: Recent Labs  Lab 02/15/18 0907  AST 29  ALT 37  ALKPHOS 58  BILITOT 0.5  PROT 7.3  ALBUMIN 4.4   No results for input(s): LIPASE, AMYLASE in the last 168 hours. No results for input(s): AMMONIA in the last 168 hours. Coagulation Profile: No results for input(s): INR, PROTIME in the last 168 hours. Cardiac Enzymes: No results for input(s): CKTOTAL, CKMB, CKMBINDEX, TROPONINI in the last 168 hours. BNP (last 3 results) No results for input(s): PROBNP in the last 8760 hours. HbA1C: No results for input(s): HGBA1C in the last 72 hours. CBG: No results for input(s): GLUCAP in the last 168 hours. Lipid Profile: No results for input(s): CHOL, HDL, LDLCALC, TRIG, CHOLHDL, LDLDIRECT in the last 72 hours. Thyroid Function Tests: No results for input(s): TSH, T4TOTAL, FREET4, T3FREE, THYROIDAB in the last 72 hours. Anemia Panel: No results for  input(s): VITAMINB12, FOLATE, FERRITIN, TIBC, IRON, RETICCTPCT in the last 72 hours. Urine analysis: No results found for: COLORURINE, APPEARANCEUR, LABSPEC, PHURINE, GLUCOSEU, HGBUR, BILIRUBINUR, KETONESUR, PROTEINUR, UROBILINOGEN, NITRITE, LEUKOCYTESUR  Radiological Exams on Admission: Mr Brain Wo Contrast  Result Date: 02/15/2018 CLINICAL DATA:  Slurred speech and loss of coordination for 3 weeks. EXAM: MRI HEAD WITHOUT CONTRAST TECHNIQUE: Multiplanar, multiecho pulse sequences of the brain and surrounding structures were obtained without intravenous contrast. COMPARISON:  02/07/2018 head CT FINDINGS: Brain: There is no evidence of acute infarct, intracranial hemorrhage, mass, midline shift, or extra-axial fluid collection. There are greater than 30 T2/FLAIR hyperintense lesions throughout the cerebral white matter with involvement of the periventricular, deep, and juxtacortical white matter. Multiple lesions are oriented perpendicularly to the lateral ventricles. There is involvement of the corpus callosum, brainstem and cerebellum. Some lesions demonstrate T2 shine through on diffusion imaging without true restricted diffusion. The ventricles and sulci are normal. Vascular: Major intracranial vascular flow voids are preserved. Skull and upper cervical spine: Unremarkable bone marrow signal. Possible FLAIR hyperintense lesion in the ventral spinal cord at C2. Sinuses/Orbits: Unremarkable orbits. Mild left maxillary sinus mucosal thickening. Clear mastoid air cells.  Other: None. IMPRESSION: 1. Numerous lesions throughout the cerebral white matter and posterior fossa with an appearance typical of multiple sclerosis. No restricted diffusion to indicate active demyelination. 2. Possible spinal cord involvement at C2. Electronically Signed   By: Sebastian Ache M.D.   On: 02/15/2018 11:57    EKG: Independently reviewed.    Assessment/Plan Principal Problem:   Multiple sclerosis exacerbation (HCC) Admit  to MedSurg/inpatient. Continue IV Solu-Medrol 1 g IVPB x5 days. Neurology team following.  Active Problems:   GERD (gastroesophageal reflux disease) Protonix 40 mg p.o. daily.    Tobacco use Nicotine replacement therapy ordered as needed. Staff to provide tobacco cessation information.    DVT prophylaxis: Heparin SQ. Code Status: Full code. Family Communication: Disposition Plan: Admit for MS exacerbation treatment. Consults called: Neuro hospitalist attending Dr. Laurence Slate. Admission status: Inpatient/MedSurg.   Bobette Mo MD [facility] Pager [phone].  If 7PM-7AM, please contact night-coverage [email or web address] Password [email or web address]  02/15/2018, 1:35 PM

## 2018-02-15 NOTE — ED Triage Notes (Signed)
Pt states he has had slurred speech for 3 weeks and a loss of coordination. Pt states he went to [facility] in [place] and they did a CT of the head and ruled out a stroke. Pt states they told him it was "just stress." Pt states the slurred speech and loss of coordination comes and goes for 3 weeks now. Talking normally with normal coordination at present. PT was not referred to anyone for follow up.

## 2018-02-15 NOTE — Consult Note (Addendum)
Neurology Consultation  Reason for Consult: Multiple sclerosis Referring Physician: Jeannette How  CC: Gait instability, decreased fine motor skills, dysarthria  History is obtained from: Patient  HPI: Randy Nichols is a 27 y.o. 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 He states that 2 years ago he noted one of his eyes became deviated medially for three weeks. He was wearing a eye patch and no images were obtained. One month ago he noted his speech was slurred. This became worse and then he noted his legs would buckle. As time went on he noted he was dropping things.  His parents convinced him to come to the hospital.  MRI brain was performed in the emergency room which is consistent with MS.     ROS: A 14 point ROS was performed and is negative except as noted in the HPI.  Past Medical History:  Diagnosis Date  . GERD (gastroesophageal reflux disease)    TUMS as needed  . 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 Tobacco use       Family History  Problem Relation Age of Onset  . Diabetes Mother   . Diabetes Maternal Uncle   . Diabetes Maternal Uncle   . Diabetes Maternal Aunt      Social History:   reports that he has been smoking cigarettes. He has a 2.50 pack-year smoking history. He has never used smokeless tobacco. He reports that he drinks alcohol. He reports that he does not use drugs.  Medications  Current Facility-Administered Medications:  .  acetaminophen (TYLENOL) tablet 650 mg, 650 mg, Oral, Q6H PRN **OR** acetaminophen (TYLENOL) suppository 650 mg, 650 mg, Rectal, Q6H PRN, Bobette Mo, MD .  heparin injection 5,000 Units, 5,000 Units, Subcutaneous, Q8H, Bobette Mo, MD .  methylPREDNISolone sodium succinate (SOLU-MEDROL) 1,000 mg in sodium chloride 0.9 % 50 mL IVPB, 1,000 mg, Intravenous, Once, Sofia, Leslie K, PA-C .  ondansetron [facility]) tablet 4 mg, 4 mg, Oral, Q6H PRN **OR** ondansetron (ZOFRAN)  injection 4 mg, 4 mg, Intravenous, Q6H PRN, Bobette Mo, MD .  pantoprazole (PROTONIX) EC tablet 40 mg, 40 mg, Oral, Daily, Aroor, Dara Lords, MD  Current Outpatient Medications:  .  acetaminophen (TYLENOL) 500 MG tablet, Take 1,000 mg by mouth every 6 (six) hours as needed., Disp: , Rfl:    Exam: Current vital signs: BP 140/88 (BP Location: Right Arm)   Pulse 78   Temp 98.5 F (36.9 C) (Oral)   Resp 18   SpO2 98%  Vital signs in last 24 hours: Temp:  [98.5 F (36.9 C)-99.1 F (37.3 C)] 98.5 F (36.9 C) (10/22 1321) Pulse Rate:  [78-99] 78 (10/22 1321) Resp:  [17-18] 18 (10/22 1321) BP: (140-150)/(88-92) 140/88 (10/22 1321) SpO2:  [98 %] 98 % (10/22 1321)  Physical Exam  Constitutional: Appears well-developed and well-nourished.  Psych: Affect appropriate to situation Eyes: No scleral injection HENT: No OP obstrucion Head: Normocephalic.  Cardiovascular: Normal rate and regular rhythm.  Respiratory: Effort normal, non-labored breathing GI: Soft.  No distension. There is no tenderness.  Skin: WDI  Neuro: Mental Status: Patient is awake, alert, oriented to person, place, month, year, and situation. Patient is able to give a clear and coherent history. No signs of aphasia or neglect Cranial Nerves: II: Visual Fields are full. Pupils are equal, round, and reactive to light.   III,IV, VI: EOMI without ptosis or diploplia.  V: Facial sensation is symmetric to  temperature VII: Facial movement is symmetric.  VIII: hearing is intact to voice X: Uvula elevates symmetrically XI: Shoulder shrug is symmetric. XII: tongue is midline without atrophy or fasciculations.  Motor: Tone is normal. Bulk is normal. 5/5 strength was present in all four extremities.  Sensory: Sensation is symmetric to light touch and temperature in the arms and legs. Deep Tendon Reflexes: 2+ and symmetric in the biceps and patellae.  Plantars: Toes are downgoing bilaterally.  Cerebellar: FNF  and HKS are intact bilaterally     Labs I have reviewed labs in epic and the results pertinent to this consultation are:   CBC    Component Value Date/Time   WBC 10.3 02/15/2018 0907   RBC 4.96 02/15/2018 0907   HGB 15.0 02/15/2018 0907   HCT 47.0 02/15/2018 0907   PLT 296 02/15/2018 0907   MCV 94.8 02/15/2018 0907   MCH 30.2 02/15/2018 0907   MCHC 31.9 02/15/2018 0907   RDW 12.2 02/15/2018 0907   LYMPHSABS 2.6 02/15/2018 0907   MONOABS 1.0 02/15/2018 0907   EOSABS 0.6 (H) 02/15/2018 0907   BASOSABS 0.1 02/15/2018 0907    CMP     Component Value Date/Time   NA 139 02/15/2018 0907   K 4.0 02/15/2018 0907   CL 109 02/15/2018 0907   CO2 22 02/15/2018 0907   GLUCOSE 95 02/15/2018 0907   BUN 11 02/15/2018 0907   CREATININE 0.90 02/15/2018 0907   CALCIUM 9.3 02/15/2018 0907   PROT 7.3 02/15/2018 0907   ALBUMIN 4.4 02/15/2018 0907   AST 29 02/15/2018 0907   ALT 37 02/15/2018 0907   ALKPHOS 58 02/15/2018 0907   BILITOT 0.5 02/15/2018 0907   GFRNONAA >60 02/15/2018 0907   GFRAA >60 02/15/2018 0907    Lipid Panel  No results found for: CHOL, TRIG, HDL, CHOLHDL, VLDL, LDLCALC, LDLDIRECT   Imaging I have reviewed the images obtained:   MRI examination of the brain-- 1. Numerous lesions throughout the cerebral white matter and posterior fossa with an appearance typical of multiple sclerosis. No restricted diffusion to indicate active demyelination. 2. Possible spinal cord involvement at C2.    Felicie Morn PA-C [facility] [phone]  M-F  (9:00 am- 5:00 PM)  02/15/2018, 2:08 PM     NEUROHOSPITALIST ADDENDUM Performed a face to face diagnostic evaluation.   I have reviewed the contents of history and physical exam as documented by PA/ARNP/Resident and agree with above documentation.  I have discussed and formulated the  plan as documented below. Edits to the note have been made as needed.   Assessment:  27 YO male with newly diagnosed  MS after presenting with intermittent speech slurring and gait instability.  MRI brain was performed showed multiple periventricular white matter lesions.  Since MRI was performed without contrast, unable to determine if any of these lesions enhance to confirm active demyelination.  However given acuity of the symptoms would recommend IV steroids as most likely some of these lesions would be contrast-enhancing.  Recommendations: --1 g of IV Solu-Medrol x 5 days --gut protection with Pepcid --We will hold off performing lumbar puncture as inpatient as MRI and history appears to be consistent with MS --follow up with MS specialist  Dr Epimenio Foot [facility]  - Check Vit D levels - MRI C spine w/wo contrast at some point    Sushanth Aroor MD [facility] [phone]   If 7pm to 7am, please call on call as listed on AMION.

## 2018-02-16 ENCOUNTER — Inpatient Hospital Stay (HOSPITAL_COMMUNITY): Payer: BLUE CROSS/BLUE SHIELD

## 2018-02-16 LAB — HIV ANTIBODY (ROUTINE TESTING W REFLEX): HIV SCREEN 4TH GENERATION: NONREACTIVE

## 2018-02-16 MED ORDER — GADOBUTROL 1 MMOL/ML IV SOLN
10.0000 mL | Freq: Once | INTRAVENOUS | Status: AC | PRN
  Administered 2018-02-16: 10 mL via INTRAVENOUS

## 2018-02-16 MED ORDER — FAMOTIDINE 20 MG PO TABS
20.0000 mg | ORAL_TABLET | Freq: Two times a day (BID) | ORAL | Status: DC
  Administered 2018-02-16: 20 mg via ORAL
  Filled 2018-02-16: qty 1

## 2018-02-16 MED ORDER — PANTOPRAZOLE SODIUM 40 MG PO TBEC
40.0000 mg | DELAYED_RELEASE_TABLET | Freq: Two times a day (BID) | ORAL | Status: DC
  Administered 2018-02-16 – 2018-02-17 (×2): 40 mg via ORAL
  Filled 2018-02-16 (×2): qty 1

## 2018-02-16 NOTE — Progress Notes (Signed)
PROGRESS NOTE                                                                                                                                                                                                             Patient Demographics:    Randy Nichols, is a 27 y.o. male, DOB - 09/07/1990, WGN:[id number]  Admit date - 02/15/2018   Admitting Physician Randy Mo, MD  Outpatient Primary MD for the patient is [facility]  LOS - 1   Chief Complaint  Patient presents with  . Aphasia       Brief Narrative    27 y.o. male with medical history significant of GERD, metacarpal fracture, tobacco use who is coming to the emergency department with complaints of slurred speech for the past 3 weeks associated with loss of coordination and stating that he thinks he has multiple sclerosis.  He was recently at [facility] where they did a CT head and was told that there was no stroke.  However, the slurred speech and decreased coordination has continued.  MRI brain significant for numerous lesions throughout the cerebral white matter and posterior.  Typical for multiple sclerosis, admitted for treatment  Subjective:    Randy Nichols today has, No headache, No chest pain, No abdominal pain - No Nausea, No new weakness tingling or numbness, No Cough - SOB.    Assessment  & Plan :    Principal Problem:   Multiple sclerosis exacerbation (HCC) Active Problems:   GERD (gastroesophageal reflux disease)   Tobacco use  New diagnosis of multiple sclerosis -Collagen input greatly appreciated, patient presents with intermittent slurred speech, gait instability, MRI brain significant for multiple periventricular white matter lesions, was without contrast. -Follow on MRI cervical spine with and without contrast -Continue with total of 5 days of IV Solu-Medrol 1 g IV daily, continue with Protonix for GI protection. -Follow with MS specialist as an  outpatient Randy Nichols from [facility]    Code Status : Full  Family Communication  : none at bedside  Disposition Plan  : home  Consults  :  Neurology  Procedures  : None  DVT Prophylaxis  :  Randy Nichols  Lab Results  Component Value Date   PLT 296 02/15/2018    Antibiotics  :   Anti-infectives (From admission, onward)  None        Objective:   Vitals:   02/15/18 1321 02/15/18 1705 02/15/18 2209 02/16/18 0456  BP: 140/88 120/78 123/66 (!) 103/55  Pulse: 78 76 75 69  Resp: 18 18 16 12   Temp: 98.5 F (36.9 C) 98.7 F (37.1 C) 98.4 F (36.9 C) 98.2 F (36.8 C)  TempSrc: Oral  Oral Oral  SpO2: 98% 98% 98% 98%  Weight:  115.1 kg    Height:  6' (1.829 m)      Wt Readings from Last 3 Encounters:  02/15/18 115.1 kg  08/29/13 109.3 kg  06/04/13 104.3 kg    No intake or output data in the 24 hours ending 02/16/18 1250   Physical Exam  Awake Alert, Oriented X 3, No new F.N deficits, Normal affect Symmetrical Chest wall movement, Good air movement bilaterally, CTAB RRR,No Gallops,Rubs or new Murmurs, No Parasternal Heave +ve B.Sounds, Abd Soft, No tenderness, No organomegaly appriciated, No rebound - guarding or rigidity. No Cyanosis, Clubbing or edema, No new Rash or bruise      Data Review:    CBC Recent Labs  Lab 02/15/18 0907  WBC 10.3  HGB 15.0  HCT 47.0  PLT 296  MCV 94.8  MCH 30.2  MCHC 31.9  RDW 12.2  LYMPHSABS 2.6  MONOABS 1.0  EOSABS 0.6*  BASOSABS 0.1    Chemistries  Recent Labs  Lab 02/15/18 0907  NA 139  K 4.0  CL 109  CO2 22  GLUCOSE 95  BUN 11  CREATININE 0.90  CALCIUM 9.3  AST 29  ALT 37  ALKPHOS 58  BILITOT 0.5   ------------------------------------------------------------------------------------------------------------------ No results for input(s): CHOL, HDL, LDLCALC, TRIG, CHOLHDL, LDLDIRECT in the last 72 hours.  No results found for:  HGBA1C ------------------------------------------------------------------------------------------------------------------ No results for input(s): TSH, T4TOTAL, T3FREE, THYROIDAB in the last 72 hours.  Invalid input(s): FREET3 ------------------------------------------------------------------------------------------------------------------ No results for input(s): VITAMINB12, FOLATE, FERRITIN, TIBC, IRON, RETICCTPCT in the last 72 hours.  Coagulation profile No results for input(s): INR, PROTIME in the last 168 hours.  No results for input(s): DDIMER in the last 72 hours.  Cardiac Enzymes No results for input(s): CKMB, TROPONINI, MYOGLOBIN in the last 168 hours.  Invalid input(s): CK ------------------------------------------------------------------------------------------------------------------ No results found for: BNP  Inpatient Medications  Scheduled Meds: . Nichols  5,000 Units Subcutaneous Q8H  . pantoprazole  40 mg Oral BID AC   Continuous Infusions: . methylPREDNISolone (SOLU-MEDROL) injection     PRN Meds:.acetaminophen **OR** acetaminophen, hydrOXYzine, nicotine, ondansetron **OR** ondansetron (ZOFRAN) IV  Micro Results No results found for this or any previous visit (from the past 240 hour(s)).  Radiology Reports Mr Brain Wo Contrast  Result Date: 02/15/2018 CLINICAL DATA:  Slurred speech and loss of coordination for 3 weeks. EXAM: MRI HEAD WITHOUT CONTRAST TECHNIQUE: Multiplanar, multiecho pulse sequences of the brain and surrounding structures were obtained without intravenous contrast. COMPARISON:  02/07/2018 head CT FINDINGS: Brain: There is no evidence of acute infarct, intracranial hemorrhage, mass, midline shift, or extra-axial fluid collection. There are greater than 30 T2/FLAIR hyperintense lesions throughout the cerebral white matter with involvement of the periventricular, deep, and juxtacortical white matter. Multiple lesions are oriented perpendicularly  to the lateral ventricles. There is involvement of the corpus callosum, brainstem and cerebellum. Some lesions demonstrate T2 shine through on diffusion imaging without true restricted diffusion. The ventricles and sulci are normal. Vascular: Major intracranial vascular flow voids are preserved. Skull and upper cervical spine: Unremarkable bone marrow signal. Possible FLAIR hyperintense  lesion in the ventral spinal cord at C2. Sinuses/Orbits: Unremarkable orbits. Mild left maxillary sinus mucosal thickening. Clear mastoid air cells. Other: None. IMPRESSION: 1. Numerous lesions throughout the cerebral white matter and posterior fossa with an appearance typical of multiple sclerosis. No restricted diffusion to indicate active demyelination. 2. Possible spinal cord involvement at C2. Electronically Signed   By: Sebastian Ache M.D.   On: 02/15/2018 11:57     Huey Bienenstock M.D on 02/16/2018 at 12:50 PM  Between 7am to 7pm - Pager - [phone]  After 7pm go to [email or web address] - password [facility]  [facility] -  Office  [phone]

## 2018-02-16 NOTE — Care Management Note (Signed)
Case Management Note  Patient Details  Name: Randy Nichols MRN: [id number] Date of Birth: 12-08-1990  Subjective/Objective:   MS                 Action/Plan:  Spoke to pt and states he works full time and has insurance with his job. 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 Pt did not have insurance cards. Will need a note for work. Provided pt with information about FMLA.  Pt states he plans to follow up with [facility] with new PCP.    Expected Discharge Date:               Expected Discharge Plan:  Home/Self Care  In-House Referral:  NA  Discharge planning Services  CM Consult  Post Acute Care Choice:  NA Choice offered to:  NA  DME Arranged:  N/A DME Agency:  NA  HH Arranged:  NA HH Agency:  NA  Status of Service:  Completed, signed off  If discussed at Long Length of Stay Meetings, dates discussed:    Additional Comments:  Elliot Cousin, RN 02/16/2018, 10:28 AM

## 2018-02-16 NOTE — Progress Notes (Addendum)
Subjective: Has had one or two episodes of of slurred speech. Has received one dose of solumedrol.   Exam: Vitals:   02/15/18 2209 02/16/18 0456  BP: 123/66 (!) 103/55  Pulse: 75 69  Resp: 16 12  Temp: 98.4 F (36.9 C) 98.2 F (36.8 C)  SpO2: 98% 98%    Physical Exam   HEENT-  Normocephalic, no lesions, without obvious abnormality.  Normal external eye and conjunctiva.   Extremities- Warm, dry and intact Musculoskeletal-no joint tenderness, deformity or swelling Skin-warm and dry, no hyperpigmentation, vitiligo, or suspicious lesions    Neuro:  Mental Status: Alert, oriented, thought content appropriate.  Speech fluent without evidence of aphasia.  Able to follow 3 step commands without difficulty. Cranial Nerves: II:  Visual fields grossly normal,  III,IV, VI: ptosis not present, extra-ocular motions intact bilaterally pupils equal, round, reactive to light and accommodation V,VII: smile symmetric, facial light touch sensation normal bilaterally VIII: hearing normal bilaterally IX,X: uvula rises midline XI: bilateral shoulder shrug XII: midline tongue extension Motor: Right : Upper extremity   5/5    Left:     Upper extremity   5/5  Lower extremity   5/5     Lower extremity   5/5 Tone and bulk:normal tone throughout; no atrophy noted Sensory: Pinprick and light touch intact throughout, bilaterally Deep Tendon Reflexes: 2+ and symmetric throughout Plantars: Right: downgoing   Left: downgoing Cerebellar: normal finger-to-nose, normal rapid alternating movements and normal heel-to-shin test     Medications:  Scheduled: . famotidine  20 mg Oral BID  . heparin  5,000 Units Subcutaneous Q8H  . pantoprazole  40 mg Oral Daily    Pertinent Labs/Diagnostics:   Mr Brain Wo Contrast  Result Date: 02/15/2018 CLINICAL DATA:  Slurred speech and loss of coordination for 3 weeks. EXAM: MRI HEAD WITHOUT CONTRAST TECHNIQUE: Multiplanar, multiecho pulse sequences of the  brain and surrounding structures were obtained without intravenous contrast. COMPARISON:  02/07/2018 head CT FINDINGS: Brain: There is no evidence of acute infarct, intracranial hemorrhage, mass, midline shift, or extra-axial fluid collection. There are greater than 30 T2/FLAIR hyperintense lesions throughout the cerebral white matter with involvement of the periventricular, deep, and juxtacortical white matter. Multiple lesions are oriented perpendicularly to the lateral ventricles. There is involvement of the corpus callosum, brainstem and cerebellum. Some lesions demonstrate T2 shine through on diffusion imaging without true restricted diffusion. The ventricles and sulci are normal. Vascular: Major intracranial vascular flow voids are preserved. Skull and upper cervical spine: Unremarkable bone marrow signal. Possible FLAIR hyperintense lesion in the ventral spinal cord at C2. Sinuses/Orbits: Unremarkable orbits. Mild left maxillary sinus mucosal thickening. Clear mastoid air cells. Other: None. IMPRESSION: 1. Numerous lesions throughout the cerebral white matter and posterior fossa with an appearance typical of multiple sclerosis. No restricted diffusion to indicate active demyelination. 2. Possible spinal cord involvement at C2. Electronically Signed   By: Sebastian Ache M.D.   On: 02/15/2018 11:57     Felicie Morn PA-C [facility] [phone]   Assessment:  27 YO male with newly diagnosed MS after presenting with intermittent speech slurring and gait instability.  MRI brain was performed showed multiple periventricular white matter lesions.  Since MRI was performed without contrast, unable to determine if any of these lesions enhance to confirm active demyelination.  However given acuity of the symptoms.  Solumedrol has been started and he has received one dose.  Recommendations: --1 g of IV Solu-Medrol x 5 days --gut protection with Pepcid --We  will hold off performing lumbar  puncture as inpatient as MRI and history appears to be consistent with MS --follow up with MS specialist  Dr Marlowe Aschoff  - Check Vit D levels - MRI C spine w/wo contrast at some point    02/16/2018, 11:40 AM    NEUROHOSPITALIST ADDENDUM Performed a face to face diagnostic evaluation.   I have reviewed the contents of history and physical exam as documented by PA/ARNP/Resident and agree with above documentation.  I have discussed and formulated the above plan as documented. Edits to the note have been made as needed.  No Significant change in exam. Will order MRI C-spine with and without contrast.    Georgiana Spinner Aroor MD [facility] [phone]   If 7pm to 7am, please call on call as listed on AMION.

## 2018-02-17 LAB — CBC
HEMATOCRIT: 45.4 % (ref 39.0–52.0)
HEMOGLOBIN: 14.4 g/dL (ref 13.0–17.0)
MCH: 29.9 pg (ref 26.0–34.0)
MCHC: 31.7 g/dL (ref 30.0–36.0)
MCV: 94.2 fL (ref 80.0–100.0)
NRBC: 0 % (ref 0.0–0.2)
PLATELETS: 300 10*3/uL (ref 150–400)
RBC: 4.82 MIL/uL (ref 4.22–5.81)
RDW: 12.1 % (ref 11.5–15.5)
WBC: 22.8 10*3/uL — ABNORMAL HIGH (ref 4.0–10.5)

## 2018-02-17 LAB — BASIC METABOLIC PANEL
ANION GAP: 9 (ref 5–15)
BUN: 15 mg/dL (ref 6–20)
CHLORIDE: 108 mmol/L (ref 98–111)
CO2: 22 mmol/L (ref 22–32)
Calcium: 8.9 mg/dL (ref 8.9–10.3)
Creatinine, Ser: 0.88 mg/dL (ref 0.61–1.24)
GFR calc Af Amer: >60 mL/min (ref >60)
GLUCOSE: 179 mg/dL — AB (ref 70–99)
POTASSIUM: 4.3 mmol/L (ref 3.5–5.1)
Sodium: 139 mmol/L (ref 135–145)

## 2018-02-17 NOTE — Progress Notes (Signed)
Pt given discharge instructions, prescriptions, and care notes. Pt verbalized understanding AEB no further questions or concerns at this time. IV was discontinued, no redness, pain, or swelling noted at this time. Telemetry discontinued and Centralized Telemetry was notified. Pt left the floor in stable condition. 

## 2018-02-17 NOTE — Discharge Instructions (Signed)
Follow with [facility] in 7 days   Get CBC, CMP, checked  by Primary MD next visit.    Activity: As tolerated with Full fall precautions use walker/cane & assistance as needed   Disposition Home   Diet: Regular diet    On your next visit with your primary care physician please Get Medicines reviewed and adjusted.   Please request your Prim.MD to go over all Hospital Tests and Procedure/Radiological results at the follow up, please get all Hospital records sent to your Prim MD by signing hospital release before you go home.   If you experience worsening of your admission symptoms, develop shortness of breath, life threatening emergency, suicidal or homicidal thoughts you must seek medical attention immediately by calling 911 or calling your MD immediately  if symptoms less severe.  You Must read complete instructions/literature along with all the possible adverse reactions/side effects for all the Medicines you take and that have been prescribed to you. Take any new Medicines after you have completely understood and accpet all the possible adverse reactions/side effects.   Do not drive, operating heavy machinery, perform activities at heights, swimming or participation in water activities or provide baby sitting services if your were admitted for syncope or siezures until you have seen by Primary MD or a Neurologist and advised to do so again.  Do not drive when taking Pain medications.    Do not take more than prescribed Pain, Sleep and Anxiety Medications  Special Instructions: If you have smoked or chewed Tobacco  in the last 2 yrs please stop smoking, stop any regular Alcohol  and or any Recreational drug use.  Wear Seat belts while driving.   Please note  You were cared for by a hospitalist during your hospital stay. If you have any questions about your discharge medications or the care you received while you were in the hospital after you  are discharged, you can call the unit and asked to speak with the hospitalist on call if the hospitalist that took care of you is not available. Once you are discharged, your primary care physician will handle any further medical issues. Please note that NO REFILLS for any discharge medications will be authorized once you are discharged, as it is imperative that you return to your primary care physician (or establish a relationship with a primary care physician if you do not have one) for your aftercare needs so that they can reassess your need for medications and monitor your lab values.

## 2018-02-17 NOTE — Evaluation (Signed)
Physical Therapy Evaluation Patient Details Name: Randy Nichols MRN: [id number] DOB: [birth year] Today's Date: 02/17/2018   History of Present Illness  Pt is a 26 y.o. M with significant PMH of GERD and tobacco use, admitted with slurred speech and gait incoordination. new diagnosis of Multiple Sclerosis.  Clinical Impression  Patient evaluated by Physical Therapy with no further acute PT needs identified. Patient denies residual symptoms. Ambulating independently hallway distances without an assistive device. Scoring 24/24 on Dynamic Gait Index, indicating he does not have dynamic balance deficits. Also within normal limits for age on static balance assessments. Educated patient/patient family on MS diagnosis, prognosis, and high intensity exercise recommendation based on current research. All education has been completed and the patient has no further questions. No follow-up Physical Therapy or equipment needs. PT is signing off. Thank you for this referral.      Follow Up Recommendations No PT follow up    Equipment Recommendations  None recommended by PT    Recommendations for Other Services       Precautions / Restrictions Precautions Precautions: None Restrictions Weight Bearing Restrictions: No      Mobility  Bed Mobility               General bed mobility comments: Received standing in room  Transfers Overall transfer level: Independent                  Ambulation/Gait Ambulation/Gait assistance: Independent Gait Distance (Feet): 350 Feet Assistive device: None Gait Pattern/deviations: WFL(Within Functional Limits)   Gait velocity interpretation: >4.37 ft/sec, indicative of normal walking speed General Gait Details: Patient with good gait speed and posture  Stairs            Wheelchair Mobility    Modified Rankin (Stroke Patients Only)       Balance Overall balance assessment: Independent                                Standardized Balance Assessment Standardized Balance Assessment : Dynamic Gait Index   Dynamic Gait Index Level Surface: Normal Change in Gait Speed: Normal Gait with Horizontal Head Turns: Normal Gait with Vertical Head Turns: Normal Gait and Pivot Turn: Normal Step Over Obstacle: Normal Step Around Obstacles: Normal Steps: Normal Total Score: 24       Pertinent Vitals/Pain Pain Assessment: No/denies pain    Home Living Family/patient expects to be discharged to:: Private residence Living Arrangements: Spouse/significant other;Children Available Help at Discharge: Family Type of Home: Mobile home Home Access: Stairs to enter   Secretary/administrator of Steps: 3 Home Layout: One level Home Equipment: None      Prior Function Level of Independence: Independent         Comments: 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        Extremity/Trunk Assessment   Upper Extremity Assessment Upper Extremity Assessment: Overall WFL for tasks assessed    Lower Extremity Assessment Lower Extremity Assessment: Overall WFL for tasks assessed    Cervical / Trunk Assessment Cervical / Trunk Assessment: Normal  Communication   Communication: No difficulties  Cognition Arousal/Alertness: Awake/alert Behavior During Therapy: WFL for tasks assessed/performed Overall Cognitive Status: Within Functional Limits for tasks assessed  General Comments  Bilateral SLS: 10 seconds, Rhomberg: 10 seconds, Tandem: 10 seconds    Exercises     Assessment/Plan    PT Assessment Patent does not need any further PT services  PT Problem List         PT Treatment Interventions      PT Goals (Current goals can be found in the Care Plan section)  Acute Rehab PT Goals Patient Stated Goal: try to be more active PT Goal Formulation: All assessment and education complete, DC therapy    Frequency     Barriers to  discharge        Co-evaluation               AM-PAC PT "6 Clicks" Daily Activity  Outcome Measure Difficulty turning over in bed (including adjusting bedclothes, sheets and blankets)?: None Difficulty moving from lying on back to sitting on the side of the bed? : None Difficulty sitting down on and standing up from a chair with arms (e.g., wheelchair, bedside commode, etc,.)?: None Help needed moving to and from a bed to chair (including a wheelchair)?: None Help needed walking in hospital room?: None Help needed climbing 3-5 steps with a railing? : None 6 Click Score: 24    End of Session   Activity Tolerance: Patient tolerated treatment well Patient left: Other (comment)(standing in room)   PT Visit Diagnosis: Unsteadiness on feet (R26.81);Other symptoms and signs involving the nervous system (R29.898)    Time: 9292-4462 PT Time Calculation (min) (ACUTE ONLY): 11 min   Charges:   PT Evaluation $PT Eval Low Complexity: 1 Low          Laurina Bustle, PT, DPT Acute Rehabilitation Services Pager [phone] Office [phone]   Vanetta Mulders 02/17/2018, 4:17 PM

## 2018-02-17 NOTE — Progress Notes (Signed)
PROGRESS NOTE                                                                                                                                                                                                             Patient Demographics:    Randy Nichols, is a 27 y.o. male, DOB - 24-Aug-1990, WUJ:[id number]  Admit date - 02/15/2018   Admitting Physician Randy Mo, MD  Outpatient Primary MD for the patient is Randy Nichols, Randy Nichols  LOS - 2   Chief Complaint  Patient presents with  . Aphasia       Brief Narrative    27 y.o. male with medical history significant of GERD, metacarpal fracture, tobacco use who is coming to the emergency department with complaints of slurred speech for the past 3 weeks associated with loss of coordination and stating that he thinks he has multiple sclerosis.  He was recently at [facility] where they did a CT head and was told that there was no stroke.  However, the slurred speech and decreased coordination has continued.  MRI brain significant for numerous lesions throughout the cerebral white matter and posterior.  Typical for multiple sclerosis, admitted for treatment   Subjective:    Randy Nichols today has, No headache, No chest pain, No abdominal pain - No Nausea, ports he feels his speech much improved today, as well his ataxia has significantly improved    Assessment  & Plan :    Principal Problem:   Multiple sclerosis exacerbation (HCC) Active Problems:   GERD (gastroesophageal reflux disease)   Tobacco use  New diagnosis of multiple sclerosis -Neurology input greatly appreciated , patient presents with intermittent slurred speech, gait instability, MRI brain significant for multiple periventricular white matter lesions, was without contrast, but MRI findings, and clinical presentation convincing for MS, continue with IV Solu-Medrol 1 g daily , today's day #3, continue with PPI for GI  protection. -MRI cervical spine significant for mild anterior cord lesion at mid C2 level as seen on previous MRI head, otherwise some disc and degenerative disease. -Follow with MS specialist as an outpatient Randy Nichols from Randy Nichols    Code Status : Full  Family Communication  : none at bedside  Disposition Plan  : home  Consults  :  Neurology  Procedures  : None  DVT Prophylaxis  :   heparin  Lab Results  Component Value Date   PLT 300 02/17/2018    Antibiotics  :   Anti-infectives (From admission, onward)   None        Objective:   Vitals:   02/15/18 2209 02/16/18 0456 02/16/18 2104 02/17/18 0610  BP: 123/66 (!) 103/55 (!) 151/78 107/89  Pulse: 75 69 98 74  Resp: 16 12 16 16   Temp: 98.4 F (36.9 C) 98.2 F (36.8 C) 98.7 F (37.1 C) 97.9 F (36.6 C)  TempSrc: Oral Oral Oral Oral  SpO2: 98% 98% 99% 99%  Weight:      Height:        Wt Readings from Last 3 Encounters:  02/15/18 115.1 kg  08/29/13 109.3 kg  06/04/13 104.3 kg     Intake/Output Summary (Last 24 hours) at 02/17/2018 1327 Last data filed at 02/17/2018 1017 Gross per 24 hour  Intake 838 ml  Output -  Net 838 ml     Physical Exam  Awake Alert, Oriented X 3, No new F.N deficits, Normal affect Symmetrical Chest wall movement, Good air movement bilaterally, CTAB RRR,No Gallops,Rubs or new Murmurs, No Parasternal Heave +ve B.Sounds, Abd Soft, No tenderness, No rebound - guarding or rigidity. No Cyanosis, Clubbing or edema, No new Rash or bruise       Data Review:    CBC Recent Labs  Lab 02/15/18 0907 02/17/18 0309  WBC 10.3 22.8*  HGB 15.0 14.4  HCT 47.0 45.4  PLT 296 300  MCV 94.8 94.2  MCH 30.2 29.9  MCHC 31.9 31.7  RDW 12.2 12.1  LYMPHSABS 2.6  --   MONOABS 1.0  --   EOSABS 0.6*  --   BASOSABS 0.1  --     Chemistries  Recent Labs  Lab 02/15/18 0907 02/17/18 0309  NA 139 139  K 4.0 4.3  CL 109 108  CO2 22 22  GLUCOSE 95 179*  BUN 11 15  CREATININE 0.90 0.88   CALCIUM 9.3 8.9  AST 29  --   ALT 37  --   ALKPHOS 58  --   BILITOT 0.5  --    ------------------------------------------------------------------------------------------------------------------ No results for input(s): CHOL, HDL, LDLCALC, TRIG, CHOLHDL, LDLDIRECT in the last 72 hours.  No results found for: HGBA1C ------------------------------------------------------------------------------------------------------------------ No results for input(s): TSH, T4TOTAL, T3FREE, THYROIDAB in the last 72 hours.  Invalid input(s): FREET3 ------------------------------------------------------------------------------------------------------------------ No results for input(s): VITAMINB12, FOLATE, FERRITIN, TIBC, IRON, RETICCTPCT in the last 72 hours.  Coagulation profile No results for input(s): INR, PROTIME in the last 168 hours.  No results for input(s): DDIMER in the last 72 hours.  Cardiac Enzymes No results for input(s): CKMB, TROPONINI, MYOGLOBIN in the last 168 hours.  Invalid input(s): CK ------------------------------------------------------------------------------------------------------------------ No results found for: BNP  Inpatient Medications  Scheduled Meds: . heparin  5,000 Units Subcutaneous Q8H  . pantoprazole  40 mg Oral BID AC   Continuous Infusions: . methylPREDNISolone (SOLU-MEDROL) injection 1,000 mg (02/16/18 1300)   PRN Meds:.acetaminophen **OR** acetaminophen, hydrOXYzine, nicotine, ondansetron **OR** ondansetron (ZOFRAN) IV  Micro Results No results found for this or any previous visit (from the past 240 hour(s)).  Radiology Reports Mr Brain Wo Contrast  Result Date: 02/15/2018 CLINICAL DATA:  Slurred speech and loss of coordination for 3 weeks. EXAM: MRI HEAD WITHOUT CONTRAST TECHNIQUE: Multiplanar, multiecho pulse sequences of the brain and surrounding structures were obtained without intravenous contrast. COMPARISON:  02/07/2018 head CT FINDINGS:  Brain: There is no evidence of acute infarct,  intracranial hemorrhage, mass, midline shift, or extra-axial fluid collection. There are greater than 30 T2/FLAIR hyperintense lesions throughout the cerebral white matter with involvement of the periventricular, deep, and juxtacortical white matter. Multiple lesions are oriented perpendicularly to the lateral ventricles. There is involvement of the corpus callosum, brainstem and cerebellum. Some lesions demonstrate T2 shine through on diffusion imaging without true restricted diffusion. The ventricles and sulci are normal. Vascular: Major intracranial vascular flow voids are preserved. Skull and upper cervical spine: Unremarkable bone marrow signal. Possible FLAIR hyperintense lesion in the ventral spinal cord at C2. Sinuses/Orbits: Unremarkable orbits. Mild left maxillary sinus mucosal thickening. Clear mastoid air cells. Other: None. IMPRESSION: 1. Numerous lesions throughout the cerebral white matter and posterior fossa with an appearance typical of multiple sclerosis. No restricted diffusion to indicate active demyelination. 2. Possible spinal cord involvement at C2. Electronically Signed   By: Sebastian Ache M.D.   On: 02/15/2018 11:57   Mr Cervical Spine W Wo Contrast  Result Date: 02/16/2018 CLINICAL DATA:  27 y/o M; possible lesion of multiple sclerosis at the C2 level. EXAM: MRI CERVICAL SPINE WITHOUT AND WITH CONTRAST TECHNIQUE: Multiplanar and multiecho pulse sequences of the cervical spine, to include the craniocervical junction and cervicothoracic junction, were obtained without and with intravenous contrast. CONTRAST:  10 cc Gadavist COMPARISON:  02/15/2018 MRI head. FINDINGS: Alignment: Straightening of cervical lordosis without listhesis. Vertebrae: No fracture, evidence of discitis, or bone lesion. No abnormal enhancement. Cord: Midline anterior cord lesion at the mid C2 level (series 9, image 11). No additional abnormal cervical spinal cord signal.  No abnormal enhancement. Posterior Fossa, vertebral arteries, paraspinal tissues: Multiple T2 hyperintense lesions are present within the posterior fossa, better characterized on the prior MRI. Disc levels: C2-3: No significant disc displacement, foraminal stenosis, or canal stenosis. C3-4: Small central disc protrusion with mild contact on the anterior cord and mild anterior cord flattening. No foraminal or canal stenosis. C4-5: No significant disc displacement, foraminal stenosis, or canal stenosis. C5-6: Disc bulge with right subarticular protrusion, right subarticular annular fissure. Moderate right and mild left foraminal stenosis. Mild canal stenosis. Disc contact on the right anterior cord with cord flattening. C6-7: Disc bulge with central disc protrusion. Mild bilateral foraminal stenosis. Disc contact on the anterior cord with mild cord flattening. Mild canal stenosis. C7-T1: No significant disc displacement, foraminal stenosis, or canal stenosis. IMPRESSION: 1. Midline anterior cord lesion at mid C2 level as seen on MRI of the head. No additional cervical spinal cord lesion identified. No abnormal enhancement to suggest active inflammation. 2. Prominent multilevel discogenic degenerative changes greatest at the C5-6 and C6-7 levels. 3. C5-6 and C6-7 disc protrusions contact the anterior cord with cord flattening and results in mild canal stenosis. 4. Moderate right C5-6 foraminal stenosis. Mild left C5-6 and bilateral C6-7 foraminal stenosis. Electronically Signed   By: Mitzi Hansen M.D.   On: 02/16/2018 17:26     Huey Bienenstock M.D on 02/17/2018 at 1:27 PM  Between 7am to 7pm - Pager - [phone]  After 7pm go to [email or web address] - password [facility]  [facility] -  Office  [phone]

## 2018-02-17 NOTE — Progress Notes (Signed)
Brief History ( From Dr Delaney Meigs original  consult note)  Randy Nichols is a 27 y.o. 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 He states that 2 years ago he noted one of his eyes became deviated medially for three weeks. He was wearing a eye patch and no images were obtained. One month ago he noted his speech was slurred. This became worse and then he noted his legs would buckle. As time went on he noted he was dropping things.  His parents convinced him to come to the hospital.  MRI brain was performed in the emergency room which is consistent with MS.  Subjective No new complaints. Overall feels fine currently but deficits continue to come and go - especially dysarthria.  Pt would like to go home and finish treatment on an out pt basis if possible.     Past Medical History Past Medical History:  Diagnosis Date  . GERD (gastroesophageal reflux disease)    TUMS as needed  . 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 Tobacco use     Past Surgical History Past Surgical History:  Procedure Laterality Date  . APPENDECTOMY    . OPEN REDUCTION INTERNAL FIXATION (ORIF) HAND Right 06/04/2013   Procedure: OPEN REDUCTION INTERNAL FIXATION (ORIF) HAND;  Surgeon: Tami Ribas, MD;  Location: [facility];  Service: Orthopedics;  Laterality: Right;  . OPEN REDUCTION INTERNAL FIXATION (ORIF) METACARPAL Right 08/29/2013   Procedure: OPEN REDUCTION INTERNAL FIXATION (ORIF) RIGHT RING FINGER METACARPAL WITH DISTAL RADIUS BONE GRAFT;  Surgeon: Tami Ribas, MD;  Location: [facility];  Service: Orthopedics;  Laterality: Right;    Allergies Allergies  Allergen Reactions  . Penicillins Hives    Has patient had a PCN reaction causing immediate rash, facial/tongue/throat swelling, SOB or lightheadedness with hypotension: No Has patient had a PCN reaction causing severe rash involving mucus membranes or skin necrosis: No Has patient had a PCN  reaction that required hospitalization: No Has patient had a PCN reaction occurring within the last 10 years: No If all of the above answers are "NO", then may proceed with Cephalosporin use.    Home Medications Medications Prior to Admission  Medication Sig Dispense Refill  . acetaminophen (TYLENOL) 500 MG tablet Take 1,000 mg by mouth every 6 (six) hours as needed.      Hospital Medications . heparin  5,000 Units Subcutaneous Q8H  . pantoprazole  40 mg Oral BID AC     Objective  Intake/Output from previous day: 10/23 0701 - 10/24 0700 In: 720 [P.O.:720] Out: -  Intake/Output this shift: Total I/O In: 118 [P.O.:118] Out: -  Nutritional status:  Diet Order            Diet Heart Room service appropriate? Yes; Fluid consistency: Thin  Diet effective now               Physical Exam  Vitals:   02/15/18 2209 02/16/18 0456 02/16/18 2104 02/17/18 0610  BP: 123/66 (!) 103/55 (!) 151/78 107/89  Pulse: 75 69 98 74  Resp: 16 12 16 16   Temp: 98.4 F (36.9 C) 98.2 F (36.8 C) 98.7 F (37.1 C) 97.9 F (36.6 C)  TempSrc: Oral Oral Oral Oral  SpO2: 98% 98% 99% 99%  Weight:      Height:       General - Pleasant 27 yo male in NAD Heart - Regular rate and rhythm  Lungs -  Clear to auscultation Abdomen - Soft - non tender Extremities - Distal pulses intact - no edema Skin - Warm and dry  Neurologic Exam:   Mental Status:  Alert, oriented, thought content appropriate. Speech without evidence of dysarthria or aphasia. Able to follow 3 step commands without difficulty.  Cranial Nerves:  II-bilateral visual fields intact III/IV/VI-Pupils were equal and reacted. Extraocular movements were full.  V/VII-no facial numbness and no facial weakness.  VIII-hearing normal.  X-symmetrical palatal movement. Episode of dysarthria while I was present. XII-midline tongue extension  Motor: Right : Upper extremity   5/5    Left:     Upper extremity   5/5  Lower extremity    5/5     Lower extremity   5/5 Tone and bulk:normal tone throughout; no atrophy noted Sensory: Intact to light touch in all extremities. Deep Tendon Reflexes: 2/4 throughout Plantars: Downgoing bilaterally  Cerebellar: Normal finger to nose and heel to shin bilaterally. Gait: not tested    LABORATORY RESULTS:  Basic Metabolic Panel: Recent Labs  Lab 02/15/18 0907 02/17/18 0309  NA 139 139  K 4.0 4.3  CL 109 108  CO2 22 22  GLUCOSE 95 179*  BUN 11 15  CREATININE 0.90 0.88  CALCIUM 9.3 8.9    Liver Function Tests: Recent Labs  Lab 02/15/18 0907  AST 29  ALT 37  ALKPHOS 58  BILITOT 0.5  PROT 7.3  ALBUMIN 4.4   No results for input(s): LIPASE, AMYLASE in the last 168 hours. No results for input(s): AMMONIA in the last 168 hours.  CBC: Recent Labs  Lab 02/15/18 0907 02/17/18 0309  WBC 10.3 22.8*  NEUTROABS 6.1  --   HGB 15.0 14.4  HCT 47.0 45.4  MCV 94.8 94.2  PLT 296 300    Cardiac Enzymes: No results for input(s): CKTOTAL, CKMB, CKMBINDEX, TROPONINI in the last 168 hours.  Lipid Panel: No results for input(s): CHOL, TRIG, HDL, CHOLHDL, VLDL, LDLCALC in the last 168 hours.  CBG: No results for input(s): GLUCAP in the last 168 hours.  Microbiology:   Coagulation Studies: No results for input(s): LABPROT, INR in the last 72 hours.  Miscellaneous Labs:   IMAGING RESULTS   Mr Cervical Spine W Wo Contrast 02/16/2018 IMPRESSION:  1. Midline anterior cord lesion at mid C2 level as seen on MRI of the head. No additional cervical spinal cord lesion identified. No abnormal enhancement to suggest active inflammation.  2. Prominent multilevel discogenic degenerative changes greatest at the C5-6 and C6-7 levels.  3. C5-6 and C6-7 disc protrusions contact the anterior cord with cord flattening and results in mild canal stenosis.  4. Moderate right C5-6 foraminal stenosis. Mild left C5-6 and bilateral C6-7 foraminal stenosis.     Mr Brain Wo  Contrast 02/15/2018 IMPRESSION:  1. Numerous lesions throughout the cerebral white matter and posterior fossa with an appearance typical of multiple sclerosis. No restricted diffusion to indicate active demyelination.  2. Possible spinal cord involvement at C2.      Assessment/Plan:   Per Dr Aroor  1 g of IV Solu-Medrol x 5 days (started 10/24 Day #2)  Gut protection with Pepcid  We will hold off performing lumbar puncture as inpatient as MRI and history appears to be consistent with MS  follow up with MS specialist  Dr Epimenio Foot [facility]   Check Vit D levels - pending  MR C spine as above  Pt agrees to stop smoking.  Consider PT / OT evals while here (  ordered)  Dr Aroor's note to follow.   Randy See PA-C [facility] Pager [phone] 02/17/2018, 12:26 PM    NEUROHOSPITALIST ADDENDUM Performed a face to face diagnostic evaluation.   I have reviewed the contents of history and physical exam as documented by PA/ARNP/Resident and agree with above documentation.  I have discussed and formulated the above plan as documented. Edits to the note have been made as needed.  Patient with new diagnosis of MS.  Has received 3 days of IV steroids and is doing much better, no longer complaining of incoordination or slurred speech.  Obtain MRI brain without contrast and MRI C-spine with and without contrast.  Patient is feeling much better and wants to go home.  I do not think there is any additional benefit in receiving 2 days of steroids.  I have sent a message to Dr. Epimenio Foot for expedited follow-up.  Patient needs to follow-up to start disease modifying agent to prevent MS progression.    Georgiana Spinner Aroor MD [facility] [phone]   If 7pm to 7am, please call on call as listed on AMION.

## 2018-02-17 NOTE — Discharge Summary (Signed)
Randy Nichols, is a 27 y.o. male  DOB 10/24/1990  MRN [id number].  Admission date:  02/15/2018  Admitting Physician  Bobette Mo, MD  Discharge Date:  02/17/2018   Primary MD  Pllc, [facility]  Recommendations for primary care physician for things to follow:  -Follow on final results of vitamin D, results still pending at time of discharge -Patient to follow with Dr. Epimenio Foot from [facility] regarding new diagnosis of MS, Ambulatory referral has been sent  Admission Diagnosis  MS (multiple sclerosis) (HCC) [G35]   Discharge Diagnosis  MS (multiple sclerosis) (HCC) [G35]    Principal Problem:   Multiple sclerosis exacerbation (HCC) Active Problems:   GERD (gastroesophageal reflux disease)   Tobacco use      Past Medical History:  Diagnosis Date  . GERD (gastroesophageal reflux disease)    TUMS as needed  . 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 Tobacco use     Past Surgical History:  Procedure Laterality Date  . APPENDECTOMY    . OPEN REDUCTION INTERNAL FIXATION (ORIF) HAND Right 06/04/2013   Procedure: OPEN REDUCTION INTERNAL FIXATION (ORIF) HAND;  Surgeon: Tami Ribas, MD;  Location: [facility] OR;  Service: Orthopedics;  Laterality: Right;  . OPEN REDUCTION INTERNAL FIXATION (ORIF) METACARPAL Right 08/29/2013   Procedure: OPEN REDUCTION INTERNAL FIXATION (ORIF) RIGHT RING FINGER METACARPAL WITH DISTAL RADIUS BONE GRAFT;  Surgeon: Tami Ribas, MD;  Location: [facility];  Service: Orthopedics;  Laterality: Right;       History of present illness and  Hospital Course:     Kindly see H&P for history of present illness and admission details, please review complete Labs, Consult reports and Test reports for all details in brief  HPI  from the history and physical done on the day of admission 02/15/2018   HPI: [name] is a 27 y.o. male with  medical history significant of GERD, metacarpal fracture, tobacco use who is coming to the emergency department with complaints of slurred speech for the past 3 weeks associated with loss of coordination and stating that he thinks he has multiple sclerosis.  He was recently at [facility] where they did a CT head and was told that there was no stroke.  However, the slurred speech and decreased coordination has continued.  He denies fever, chills, sore throat, wheezing, hemoptysis, chest pain, palpitations, dizziness, diaphoresis, pitting edema of the lower extremities, PND or orthopnea.  Denies abdominal pain, nausea, emesis, diarrhea, constipation, melena or hematochezia.  No dysuria, frequency or hematuria.  No polyuria, polydipsia, polyphagia.  Denies heat or cold intolerance.  ED Course: Initial vital signs temperature 99.1 F, pulse 99, respirations 17, blood pressure 150/92 mmHg O2 sat 98% on room air.  The patient received 1000 mg of Solu-Medrol IVPB.  His CBC was normal with a white count of 10.3, hemoglobin 15 and platelets 296.  CMP was normal.   Imaging: His MRI of brain showed numerous lesions throughout the cerebral  white matter and posterior fossa with an appearance typical of multiple sclerosis.  No restricted diffusion to indicate active demyelination.  Possible spinal cord involvement at C2.  Please see images and full radiology report for further detail.  Hospital Course    27 y.o.malewith medical history significant of GERD, metacarpal fracture, tobacco use who is coming to the emergency department with complaints of slurred speech for the past 3 weeks associated with loss of coordination and stating that he thinks he has multiple sclerosis. He was recently at [facility] where they did a CT head and was told that there was no stroke. However, the slurred speech and decreased coordination has continued.  MRI brain significant for numerous lesions throughout the cerebral white matter and  posterior.  Typical for multiple sclerosis, admitted for treatment  New diagnosis of multiple sclerosis -Neurology input greatly appreciated , patient presents with intermittent slurred speech, gait instability, MRI brain significant for multiple periventricular white matter lesions, was without contrast, but MRI findings, and clinical presentation convincing for MS, continue with IV Solu-Medrol 1 g daily , today's day #3, kept on Protonix for GI protection as well, had significant improvement of his symptoms, I have discussed with neurology who evaluated the patient, the recommendation is for 3 days treatment given symptoms and presentation and significant improvement, so patient will be able to be discharged today with follow-up with neurology as an outpatient. -MRI cervical spine significant for mild anterior cord lesion at mid C2 level as seen on previous MRI head, otherwise some disc and degenerative disease. -Follow with MS specialist as an outpatient Dr. Epimenio Foot from [facility]  Discharge Condition:   Stable Follow UP  Follow-up Information    Pllc, [facility] Follow up.   Specialty:  Family Medicine Why:  please call to arrange follow up appointment  Contact information: [address] [place] [address] [phone]        Sater, Pearletha Furl, MD Follow up in 1 week(s).   Specialty:  Neurology Contact information: [address] [place] [address] [phone]             Discharge Instructions  and  Discharge Medications     Discharge Instructions    Ambulatory referral to Neurology   Complete by:  As directed    An appointment is requested in approximately: 1 week New Diagnosis of MS   Discharge instructions   Complete by:  As directed    Follow with Primary MD Pllc, [facility] in 7 days   Get CBC, CMP, checked  by Primary MD next visit.    Activity: As tolerated with Full fall precautions use walker/cane &  assistance as needed   Disposition Home   Diet: Regular diet    On your next visit with your primary care physician please Get Medicines reviewed and adjusted.   Please request your Prim.MD to go over all Hospital Tests and Procedure/Radiological results at the follow up, please get all Hospital records sent to your Prim MD by signing hospital release before you go home.   If you experience worsening of your admission symptoms, develop shortness of breath, life threatening emergency, suicidal or homicidal thoughts you must seek medical attention immediately by calling 911 or calling your MD immediately  if symptoms less severe.  You Must read complete instructions/literature along with all the possible adverse reactions/side effects for all the Medicines you take and that have been prescribed to you. Take any new Medicines after you  have completely understood and accpet all the possible adverse reactions/side effects.   Do not drive, operating heavy machinery, perform activities at heights, swimming or participation in water activities or provide baby sitting services if your were admitted for syncope or siezures until you have seen by Primary MD or a Neurologist and advised to do so again.  Do not drive when taking Pain medications.    Do not take more than prescribed Pain, Sleep and Anxiety Medications  Special Instructions: If you have smoked or chewed Tobacco  in the last 2 yrs please stop smoking, stop any regular Alcohol  and or any Recreational drug use.  Wear Seat belts while driving.   Please note  You were cared for by a hospitalist during your hospital stay. If you have any questions about your discharge medications or the care you received while you were in the hospital after you are discharged, you can call the unit and asked to speak with the hospitalist on call if the hospitalist that took care of you is not available. Once you are discharged, your primary care  physician will handle any further medical issues. Please note that NO REFILLS for any discharge medications will be authorized once you are discharged, as it is imperative that you return to your primary care physician (or establish a relationship with a primary care physician if you do not have one) for your aftercare needs so that they can reassess your need for medications and monitor your lab values.   Increase activity slowly   Complete by:  As directed      Allergies as of 02/17/2018      Reactions   Penicillins Hives   Has patient had a PCN reaction causing immediate rash, facial/tongue/throat swelling, SOB or lightheadedness with hypotension: No Has patient had a PCN reaction causing severe rash involving mucus membranes or skin necrosis: No Has patient had a PCN reaction that required hospitalization: No Has patient had a PCN reaction occurring within the last 10 years: No If all of the above answers are "NO", then may proceed with Cephalosporin use.      Medication List    STOP taking these medications   acetaminophen 500 MG tablet Commonly known as:  TYLENOL         Diet and Activity recommendation: See Discharge Instructions above   Consults obtained -  Neurology   Major procedures and Radiology Reports - PLEASE review detailed and final reports for all details, in brief -      Mr Brain Wo Contrast  Result Date: 02/15/2018 CLINICAL DATA:  Slurred speech and loss of coordination for 3 weeks. EXAM: MRI HEAD WITHOUT CONTRAST TECHNIQUE: Multiplanar, multiecho pulse sequences of the brain and surrounding structures were obtained without intravenous contrast. COMPARISON:  02/07/2018 head CT FINDINGS: Brain: There is no evidence of acute infarct, intracranial hemorrhage, mass, midline shift, or extra-axial fluid collection. There are greater than 30 T2/FLAIR hyperintense lesions throughout the cerebral white matter with involvement of the periventricular, deep, and  juxtacortical white matter. Multiple lesions are oriented perpendicularly to the lateral ventricles. There is involvement of the corpus callosum, brainstem and cerebellum. Some lesions demonstrate T2 shine through on diffusion imaging without true restricted diffusion. The ventricles and sulci are normal. Vascular: Major intracranial vascular flow voids are preserved. Skull and upper cervical spine: Unremarkable bone marrow signal. Possible FLAIR hyperintense lesion in the ventral spinal cord at C2. Sinuses/Orbits: Unremarkable orbits. Mild left maxillary sinus mucosal thickening. Clear mastoid air cells.  Other: None. IMPRESSION: 1. Numerous lesions throughout the cerebral white matter and posterior fossa with an appearance typical of multiple sclerosis. No restricted diffusion to indicate active demyelination. 2. Possible spinal cord involvement at C2. Electronically Signed   By: Sebastian Ache M.D.   On: 02/15/2018 11:57   Mr Cervical Spine W Wo Contrast  Result Date: 02/16/2018 CLINICAL DATA:  27 y/o M; possible lesion of multiple sclerosis at the C2 level. EXAM: MRI CERVICAL SPINE WITHOUT AND WITH CONTRAST TECHNIQUE: Multiplanar and multiecho pulse sequences of the cervical spine, to include the craniocervical junction and cervicothoracic junction, were obtained without and with intravenous contrast. CONTRAST:  10 cc Gadavist COMPARISON:  02/15/2018 MRI head. FINDINGS: Alignment: Straightening of cervical lordosis without listhesis. Vertebrae: No fracture, evidence of discitis, or bone lesion. No abnormal enhancement. Cord: Midline anterior cord lesion at the mid C2 level (series 9, image 11). No additional abnormal cervical spinal cord signal. No abnormal enhancement. Posterior Fossa, vertebral arteries, paraspinal tissues: Multiple T2 hyperintense lesions are present within the posterior fossa, better characterized on the prior MRI. Disc levels: C2-3: No significant disc displacement, foraminal stenosis, or  canal stenosis. C3-4: Small central disc protrusion with mild contact on the anterior cord and mild anterior cord flattening. No foraminal or canal stenosis. C4-5: No significant disc displacement, foraminal stenosis, or canal stenosis. C5-6: Disc bulge with right subarticular protrusion, right subarticular annular fissure. Moderate right and mild left foraminal stenosis. Mild canal stenosis. Disc contact on the right anterior cord with cord flattening. C6-7: Disc bulge with central disc protrusion. Mild bilateral foraminal stenosis. Disc contact on the anterior cord with mild cord flattening. Mild canal stenosis. C7-T1: No significant disc displacement, foraminal stenosis, or canal stenosis. IMPRESSION: 1. Midline anterior cord lesion at mid C2 level as seen on MRI of the head. No additional cervical spinal cord lesion identified. No abnormal enhancement to suggest active inflammation. 2. Prominent multilevel discogenic degenerative changes greatest at the C5-6 and C6-7 levels. 3. C5-6 and C6-7 disc protrusions contact the anterior cord with cord flattening and results in mild canal stenosis. 4. Moderate right C5-6 foraminal stenosis. Mild left C5-6 and bilateral C6-7 foraminal stenosis. Electronically Signed   By: Mitzi Hansen M.D.   On: 02/16/2018 17:26    Micro Results    No results found for this or any previous visit (from the past 240 hour(s)).     Today   Subjective:   [name] today has, No headache, No chest pain, No abdominal pain - No Nausea, ports he feels his speech much improved today, as well his ataxia has significantly improved   Objective:   Blood pressure 137/83, pulse (!) 102, temperature 99.5 F (37.5 C), temperature source Oral, resp. rate 20, height 6' (1.829 m), weight 115.1 kg, SpO2 97 %.   Intake/Output Summary (Last 24 hours) at 02/17/2018 1545 Last data filed at 02/17/2018 1300 Gross per 24 hour  Intake 1014 ml  Output -  Net 1014 ml     Exam Awake Alert, Oriented x 3, No new F.N deficits, Normal affect Symmetrical Chest wall movement, Good air movement bilaterally, CTAB RRR,No Gallops,Rubs or new Murmurs, No Parasternal Heave +ve B.Sounds, Abd Soft, Non tender, No rebound -guarding or rigidity. No Cyanosis, Clubbing or edema, No new Rash or bruise  Data Review   CBC w Diff:  Lab Results  Component Value Date   WBC 22.8 (H) 02/17/2018   HGB 14.4 02/17/2018   HCT 45.4 02/17/2018   PLT 300  02/17/2018   LYMPHOPCT 25 02/15/2018   MONOPCT 10 02/15/2018   EOSPCT 5 02/15/2018   BASOPCT 1 02/15/2018    CMP:  Lab Results  Component Value Date   NA 139 02/17/2018   K 4.3 02/17/2018   CL 108 02/17/2018   CO2 22 02/17/2018   BUN 15 02/17/2018   CREATININE 0.88 02/17/2018   PROT 7.3 02/15/2018   ALBUMIN 4.4 02/15/2018   BILITOT 0.5 02/15/2018   ALKPHOS 58 02/15/2018   AST 29 02/15/2018   ALT 37 02/15/2018  .   Total Time in preparing paper work, data evaluation and todays exam - 30 minutes  Huey Bienenstock M.D on 02/17/2018 at 3:45 PM  [facility]   Office  [phone]

## 2018-02-18 ENCOUNTER — Telehealth: Payer: Self-pay | Admitting: *Deleted

## 2018-02-18 LAB — CALCITRIOL (1,25 DI-OH VIT D): VIT D 1 25 DIHYDROXY: 53.7 pg/mL (ref 19.9–79.3)

## 2018-02-18 NOTE — Telephone Encounter (Signed)
Again reached message stating vm is not set up/fim

## 2018-02-18 NOTE — Telephone Encounter (Signed)
Attempted to contact pt. to offer appt. on Monday, but vm is not set up/fim

## 2018-02-18 NOTE — Telephone Encounter (Signed)
-----   Message from Asa Lente, MD sent at 02/17/2018  6:05 PM EDT ----- Rushie Goltz, Please see if we can get him on the schedule for Monday afternoon  Richard ----- Message ----- From: Elba Barman, MD Sent: 02/17/2018   5:23 PM EDT To: Asa Lente, MD  Dr Epimenio Foot,   New diagnosis of MS.  Could you have the patient see you as an expedited follow up to start DMD.    Thanks Sushanth

## 2018-02-18 NOTE — Telephone Encounter (Signed)
-----   Message from Richard A Sater, MD sent at 02/17/2018  6:05 PM EDT ----- Viviann Broyles, Please see if we can get him on the schedule for Monday afternoon  Richard ----- Message ----- From: Aroor, Sushanth R, MD Sent: 02/17/2018   5:23 PM EDT To: Richard A Sater, MD  Dr Sater,   New diagnosis of MS.  Could you have the patient see you as an expedited follow up to start DMD.    Thanks Sushanth   

## 2018-02-18 NOTE — Telephone Encounter (Signed)
-----   Message from Richard A Sater, MD sent at 02/17/2018  6:05 PM EDT ----- Roma Bierlein, Please see if we can get him on the schedule for Monday afternoon  Richard ----- Message ----- From: Aroor, Sushanth R, MD Sent: 02/17/2018   5:23 PM EDT To: Richard A Sater, MD  Dr Sater,   New diagnosis of MS.  Could you have the patient see you as an expedited follow up to start DMD.    Thanks Sushanth   

## 2018-02-18 NOTE — Telephone Encounter (Signed)
LMOM for mom, Randy Nichols, phone# [phone] to call, or have pt. to call for an appt./fim

## 2018-02-21 ENCOUNTER — Telehealth: Payer: Self-pay | Admitting: *Deleted

## 2018-02-21 ENCOUNTER — Encounter: Payer: Self-pay | Admitting: *Deleted

## 2018-02-21 NOTE — Telephone Encounter (Signed)
Attempted again to contact pt. on his mobile #, but received message that vm is not set up.  LMOM (identiifed vm) on his mother's Dennis Bast) # for pt. to call for appt.  As I have made mult. attempts to contact pt., I have mailed an unable to contact letter to pt's home address on file ([address] today/fim

## 2018-02-21 NOTE — Telephone Encounter (Signed)
-----   Message from Richard A Sater, MD sent at 02/17/2018  6:05 PM EDT ----- Yianna Tersigni, Please see if we can get him on the schedule for Monday afternoon  Richard ----- Message ----- From: Aroor, Sushanth R, MD Sent: 02/17/2018   5:23 PM EDT To: Richard A Sater, MD  Dr Sater,   New diagnosis of MS.  Could you have the patient see you as an expedited follow up to start DMD.    Thanks Sushanth   

## 2018-02-28 ENCOUNTER — Ambulatory Visit: Payer: BLUE CROSS/BLUE SHIELD | Admitting: Neurology

## 2018-02-28 ENCOUNTER — Telehealth: Payer: Self-pay | Admitting: *Deleted

## 2018-02-28 ENCOUNTER — Encounter: Payer: Self-pay | Admitting: Neurology

## 2018-02-28 VITALS — BP 121/76 | HR 75 | Ht 72.0 in | Wt 263.0 lb

## 2018-02-28 DIAGNOSIS — R270 Ataxia, unspecified: Secondary | ICD-10-CM | POA: Diagnosis not present

## 2018-02-28 DIAGNOSIS — R7989 Other specified abnormal findings of blood chemistry: Secondary | ICD-10-CM

## 2018-02-28 DIAGNOSIS — Z79899 Other long term (current) drug therapy: Secondary | ICD-10-CM | POA: Diagnosis not present

## 2018-02-28 DIAGNOSIS — R5383 Other fatigue: Secondary | ICD-10-CM | POA: Diagnosis not present

## 2018-02-28 DIAGNOSIS — G35 Multiple sclerosis: Secondary | ICD-10-CM

## 2018-02-28 NOTE — Telephone Encounter (Signed)
Called pt. He will come in the morning to have JCV lab completed. He works 3rd shift and gets off around 8am.

## 2018-02-28 NOTE — Progress Notes (Signed)
[facility]  PATIENT: Randy Nichols DOB: 1990/08/11  REFERRING DOCTOR OR PCP:  Dr. Laurence Slate SOURCE: patient, notes from recent hospitalization.    Imaging and lab results.  MRI images personally reviewed.  _________________________________   HISTORICAL  CHIEF COMPLAINT:  Chief Complaint  Patient presents with  . New Patient (Initial Visit)    RM 44, with father. Internal referral for MS.   . Multiple Sclerosis    Pt went to [facility] ED 02/15/18. Was admitted and discharged 02/17/18. Was dx with MS. Pt c/o slurred speech, trouble walking, decreased coordination. No falls. Sx started about a month and a half ago. No family hx of MS.     HISTORY OF PRESENT ILLNESS:  I had the pleasure of seeing the patient, Randy Nichols, at the MS center at [facility] for neurologic consultation regarding his recent diagnosis of multiple sclerosis.  He is a 27 year old man who presented to the [facility] emergency room 02/15/2018.   About a month earlier, he had slurred speech and difficulty with gait and coordination (left = right).      In mid-October, he went to the ED in [place].  They were concerned about a stroke and did a CT.   He went to [facility] 02/15/18 and an MRI was consistent with MS.    He received 3 days of Solu-Medrol x 3 days with mild improvement.     Gait is doing a little worse over the last week.   He seems to fluctuate some.    He sometimes has trouble controlling jis legs but is able to climb a ladder carefully,  He notes that he seems to do worse with gait when he also has more slurring of words.    He denies numbness in general but had one day of numbness in his right cheek that completely resolved.      He notes more fatigue the past 6 weeks and he tires out easily at work.     He has fatigue in the morning but it may worsen as the day goes on.    He sleeps 6 to 8 hours a day.   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      He denies mood issues.   He feels over the last few weeks that focus and attention are worse.,   I personally reviewed the MRI of the brain dated 02/15/2018 and the MRI of the cervical spine dated 02/16/2018.  The MRI of the brain shows multiple T2/FLAIR hyperintense foci.  There are lesions in the right middle cerebellar peduncle, left lateral pons, a large midline anterior upper pons lesion and multiple foci in the hemispheres.  These are consistent with MS plaques.  The study was done without contrast.  The focus in the left pons had restricted diffusion and may be more acute.  The upper pontine midline focus and some of the hemispheric foci were mildly hyperintense on diffusion-weighted images and some of these may also be subacute.  Unfortunately, the study was done without contrast.  The MRI of the cervical spine showed a focus at C2 anteriorly.  REVIEW OF SYSTEMS: Constitutional: No fevers, chills, sweats, or change in appetite Eyes: No visual changes, double vision, eye pain Ear, nose and throat: No hearing loss, ear pain, nasal congestion, sore throat Cardiovascular: No chest pain, palpitations Respiratory: No shortness of breath at rest or with exertion.   No wheezes GastrointestinaI: No nausea,  vomiting, diarrhea, abdominal pain, fecal incontinence Genitourinary: No dysuria, urinary retention or frequency.  No nocturia. Musculoskeletal: No neck pain, back pain Integumentary: No rash, pruritus, skin lesions Neurological: as above Psychiatric: No depression at this time.  No anxiety Endocrine: No palpitations, diaphoresis, change in appetite, change in weigh or increased thirst Hematologic/Lymphatic: No anemia, purpura, petechiae. Allergic/Immunologic: No itchy/runny eyes, nasal congestion, recent allergic reactions, rashes  ALLERGIES: Allergies  Allergen Reactions  . Penicillins Hives    Has patient had a PCN reaction causing immediate rash, facial/tongue/throat swelling, SOB  or lightheadedness with hypotension: No Has patient had a PCN reaction causing severe rash involving mucus membranes or skin necrosis: No Has patient had a PCN reaction that required hospitalization: No Has patient had a PCN reaction occurring within the last 10 years: No If all of the above answers are "NO", then may proceed with Cephalosporin use.    HOME MEDICATIONS: No current outpatient medications on file.  PAST MEDICAL HISTORY: Past Medical History:  Diagnosis Date  . GERD (gastroesophageal reflux disease)    TUMS as needed  . 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 Tobacco use     PAST SURGICAL HISTORY: Past Surgical History:  Procedure Laterality Date  . APPENDECTOMY    . OPEN REDUCTION INTERNAL FIXATION (ORIF) HAND Right 06/04/2013   Procedure: OPEN REDUCTION INTERNAL FIXATION (ORIF) HAND;  Surgeon: Tami Ribas, MD;  Location: [facility] OR;  Service: Orthopedics;  Laterality: Right;  . OPEN REDUCTION INTERNAL FIXATION (ORIF) METACARPAL Right 08/29/2013   Procedure: OPEN REDUCTION INTERNAL FIXATION (ORIF) RIGHT RING FINGER METACARPAL WITH DISTAL RADIUS BONE GRAFT;  Surgeon: Tami Ribas, MD;  Location: [facility];  Service: Orthopedics;  Laterality: Right;    FAMILY HISTORY: Family History  Problem Relation Age of Onset  . Diabetes Mother   . Diabetes Maternal Uncle   . Diabetes Maternal Uncle     SOCIAL HISTORY:  Social History   Socioeconomic History  . Marital status: Single    Spouse name: Not on file  . Number of children: Not on file  . Years of education: 50  . Highest education level: Not on file  Occupational History  . Occupation: Environmental consultant  Social Needs  . Financial resource strain: Not on file  . Food insecurity:    Worry: Not on file    Inability: Not on file  . Transportation needs:    Medical: Not on file    Non-medical: Not on file  Tobacco Use  . Smoking status: Current Every Day Smoker    Packs/day:  1.00    Years: 5.00    Pack years: 5.00    Types: Cigarettes  . Smokeless tobacco: Never Used  Substance and Sexual Activity  . Alcohol use: Not Currently    Comment: socially  . Drug use: No  . Sexual activity: Yes    Birth control/protection: IUD    Comment: wife has IUD  Lifestyle  . Physical activity:    Days per week: Not on file    Minutes per session: Not on file  . Stress: Not on file  Relationships  . Social connections:    Talks on phone: Not on file    Gets together: Not on file    Attends religious service: Not on file    Active member of club or organization: Not on file    Attends meetings of clubs or organizations: Not on file    Relationship status:  Not on file  . Intimate partner violence:    Fear of current or ex partner: Not on file    Emotionally abused: Not on file    Physically abused: Not on file    Forced sexual activity: Not on file  Other Topics Concern  . Not on file  Social History Narrative   Lives with girlfriend, Teressa Senter   Caffeine use: Soda daily/tea daily   Right handed      PHYSICAL EXAM  Vitals:   02/28/18 0851  BP: 121/76  Pulse: 75  Weight: 263 lb (119.3 kg)  Height: 6' (1.829 m)    Body mass index is 35.67 kg/m.   General: The patient is well-developed and well-nourished and in no acute distress  Eyes:  Funduscopic exam shows normal optic discs and retinal vessels.   Color vision was symmetric  Neck: The neck is supple, no carotid bruits are noted.  The neck is nontender.  Cardiovascular: The heart has a regular rate and rhythm with a normal S1 and S2. There were no murmurs, gallops or rubs. Lungs are clear to auscultation.  Skin: Extremities are without significant edema.  Musculoskeletal:  Back is nontender  Neurologic Exam  Mental status: The patient is alert and oriented x 3 at the time of the examination. The patient has apparent normal recent and remote memory, with an apparently normal attention  span and concentration ability.   Speech is normal.  Cranial nerves: Extraocular movements are full. Pupils are equal, round, and reactive to light and accomodation.   Facial symmetry is present. There is good facial sensation to soft touch bilaterally.Facial strength is normal.  Trapezius and sternocleidomastoid strength is normal. No dysarthria is noted.  The tongue is midline, and the patient has symmetric elevation of the soft palate. No obvious hearing deficits are noted.  Motor:  Muscle bulk is normal.   Tone is normal. Strength is  5 / 5 in all 4 extremities.   Sensory: Sensory testing is intact to pinprick, soft touch and vibration sensation in all 4 extremities.  Coordination: Cerebellar testing reveals good finger-nose-finger and heel-to-shin bilaterally.  Gait and station: Station is normal.   Gait is normal.  Tandem gait is slightly wide.. Romberg is negative.   Reflexes: Deep tendon reflexes are normal and symmetric in the arms.  There is increase in the legs with crossed abductor responses at the knees.  There is no ankle clonus.   Plantar responses are flexor.    DIAGNOSTIC DATA (LABS, IMAGING, TESTING) - I reviewed patient records, labs, notes, testing and imaging myself where available.  Lab Results  Component Value Date   WBC 22.8 (H) 02/17/2018   HGB 14.4 02/17/2018   HCT 45.4 02/17/2018   MCV 94.2 02/17/2018   PLT 300 02/17/2018      Component Value Date/Time   NA 139 02/17/2018 0309   K 4.3 02/17/2018 0309   CL 108 02/17/2018 0309   CO2 22 02/17/2018 0309   GLUCOSE 179 (H) 02/17/2018 0309   BUN 15 02/17/2018 0309   CREATININE 0.88 02/17/2018 0309   CALCIUM 8.9 02/17/2018 0309   PROT 7.3 02/15/2018 0907   ALBUMIN 4.4 02/15/2018 0907   AST 29 02/15/2018 0907   ALT 37 02/15/2018 0907   ALKPHOS 58 02/15/2018 0907   BILITOT 0.5 02/15/2018 0907   GFRNONAA >60 02/17/2018 0309   GFRAA >60 02/17/2018 0309       ASSESSMENT AND PLAN  Multiple sclerosis  (HCC) - Plan:  Hepatitis B core antibody, total, Hepatitis B surface antigen, QuantiFERON-TB Gold Plus, Hepatitis C antibody, Hepatitis B surface antibody,qualitative, Varicella zoster antibody, IgG, Stratify JCV Antibody Test ([facility]), VITAMIN D 25 Hydroxy (Vit-D Deficiency, Fractures)  Other fatigue  Ataxia  High risk medication use - Plan: Hepatitis B core antibody, total, Hepatitis B surface antigen, QuantiFERON-TB Gold Plus, Hepatitis C antibody, Hepatitis B surface antibody,qualitative, Varicella zoster antibody, IgG, Stratify JCV Antibody Test ([facility])  Low vitamin D level - Plan: VITAMIN D 25 Hydroxy (Vit-D Deficiency, Fractures)   In summary, Mr. Kirtz is a 27 year old man who has had several neurologic symptoms including ataxia, numbness and gait disturbance over the past couple months.  The MRI of the brain and cervical spine are very consistent with multiple sclerosis.  Although the brain MRI was performed without contrast, foci in the pons or hyperintense on diffusion-weighted images and likely represent subacute lesions.  We had a long conversation about disease modifying therapies.  He is presenting in an aggressive manner with at least 2 exacerbations back-to-back, both of them likely being in the brainstem.  Therefore, he needs to be placed on an effective disease modifying therapy from day 1.  We discussed several options including Mayzent, Tysabri and Ocrevus.  I went over the risks and benefits of each of these in detail.  He is most interested in [place] or Tysabri.   We will check blood work to determine if he is a candidate for these medications.  I will also check a vitamin D level to make sure that he is not deficient and will recommend supplementation based on the results.  He will return to see me for regular visit in 3 months but hopefully we will get his infusion within the next couple of weeks.  Thank you for asking me to see Mr. Bowens.  Please let me know if I can be of  further assistance with him or other patients in the future.   Richard A. Epimenio Foot, MD, [facility] 02/28/2018, 9:10 AM Certified in Neurology, Clinical Neurophysiology, Sleep Medicine, Pain Medicine and Neuroimaging  [facility] [address], [address] [place], [place] [address] [phone]

## 2018-03-01 ENCOUNTER — Other Ambulatory Visit (INDEPENDENT_AMBULATORY_CARE_PROVIDER_SITE_OTHER): Payer: Self-pay

## 2018-03-01 ENCOUNTER — Telehealth: Payer: Self-pay | Admitting: Neurology

## 2018-03-01 DIAGNOSIS — Z0289 Encounter for other administrative examinations: Secondary | ICD-10-CM

## 2018-03-01 NOTE — Telephone Encounter (Signed)
Spoke with Meriam Sprague w/ [facility]. We can work pt in for IV solumedrol 1 gram x3 days starting tomorrow thru Friday. I gave her completed/signed order form.

## 2018-03-01 NOTE — Telephone Encounter (Signed)
Pt has returned call to RN Emma, he is asking for a call back °

## 2018-03-01 NOTE — Telephone Encounter (Signed)
I called pt back. He will come tomorrow after work around [employer] for first IV steroid infusion.

## 2018-03-01 NOTE — Telephone Encounter (Signed)
I called pt back. Dr. Epimenio Foot mentioned that if he needed it, he could come back for steroid treatment. He reports having speech problems. Also having ambulation issues. When he was in the hospital a couple weeks ago, he was supposed to stay at hospital for 5 days but was d/c'd after 3 days. He works 3rd shift. Out of work at 8am. Has meeting tomorrow morning so will be out a little later. Advised I will speak with Dr. Epimenio Foot about bringing him in for steroid treatment.  Spoke with Dr. Epimenio Foot. Received VO for IV steroid x3 days.

## 2018-03-01 NOTE — Telephone Encounter (Signed)
Patient has requested for MD or RN to call him to discuss steroid treatment. He states his symptoms that he has are now getting worse. Patient is concerned and unsure of what to do next.

## 2018-03-01 NOTE — Telephone Encounter (Signed)
Tried calling patient, went to VM and could not leave message

## 2018-03-03 LAB — QUANTIFERON-TB GOLD PLUS
QuantiFERON Mitogen Value: >10 IU/mL
QuantiFERON Nil Value: 0.03 IU/mL
QuantiFERON TB1 Ag Value: 0.03 IU/mL
QuantiFERON TB2 Ag Value: 0.03 IU/mL
QuantiFERON-TB Gold Plus: NEGATIVE

## 2018-03-03 LAB — HEPATITIS B CORE ANTIBODY, TOTAL: Hep B Core Total Ab: NEGATIVE

## 2018-03-03 LAB — HEPATITIS C ANTIBODY: Hep C Virus Ab: <0.1 s/co ratio (ref 0.0–0.9)

## 2018-03-03 LAB — VITAMIN D 25 HYDROXY (VIT D DEFICIENCY, FRACTURES): Vit D, 25-Hydroxy: 10.7 ng/mL — ABNORMAL LOW (ref 30.0–100.0)

## 2018-03-03 LAB — VARICELLA ZOSTER ANTIBODY, IGG: Varicella zoster IgG: >4000 index (ref >165)

## 2018-03-03 LAB — HEPATITIS B SURFACE ANTIGEN: HEP B S AG: NEGATIVE

## 2018-03-03 LAB — HEPATITIS B SURFACE ANTIBODY,QUALITATIVE: Hep B Surface Ab, Qual: REACTIVE

## 2018-03-07 NOTE — Telephone Encounter (Signed)
JCV result: positive, index: 2.41. Given to Dr. Epimenio Foot to review.

## 2018-03-11 ENCOUNTER — Telehealth: Payer: Self-pay | Admitting: Neurology

## 2018-03-11 NOTE — Telephone Encounter (Signed)
I spoke to Randy Nichols about the lab results.  He is JCV high positive.  Therefore, Ocrevus would be a better option than Tysabri.  We will get his service request form turned in.

## 2018-03-14 NOTE — Telephone Encounter (Signed)
Gave completed/signed start form to [facility] to process for pt. 

## 2018-03-17 NOTE — Telephone Encounter (Signed)
Received fax notification from [facility] access solutions that they received pt start form and currently processing for pt.  Gave to [facility] for their records.

## 2018-03-28 NOTE — Telephone Encounter (Signed)
Received fax notification from [employer] that [place] Ocrevus approved from 03/21/18-09/05/18. [id number]. Gave to [facility] for their records.

## 2018-05-23 ENCOUNTER — Telehealth: Payer: Self-pay | Admitting: Neurology

## 2018-05-23 NOTE — Telephone Encounter (Signed)
Spoke with [facility]-they were able to reach the pt and get him scheduled.

## 2018-05-23 NOTE — Telephone Encounter (Signed)
Tried calling pt x2 and went straight to VM both times stating VM not set up.

## 2018-05-23 NOTE — Telephone Encounter (Signed)
Pt called wanting to know if he still needs his appt in Feb since he is still waiting on his Infusions to be approved. Please advise.

## 2018-05-23 NOTE — Telephone Encounter (Signed)
Spoke with [facility]. He was scheduled in error for March 2020. [facility] will call to re-schedule his initial dosing for day 1 and day 15 for Ocrevus.

## 2018-05-31 ENCOUNTER — Ambulatory Visit: Payer: BLUE CROSS/BLUE SHIELD | Admitting: Neurology

## 2018-06-01 ENCOUNTER — Encounter: Payer: Self-pay | Admitting: Neurology

## 2018-07-04 ENCOUNTER — Telehealth: Payer: Self-pay | Admitting: Neurology

## 2018-07-04 DIAGNOSIS — G35 Multiple sclerosis: Secondary | ICD-10-CM

## 2018-07-04 DIAGNOSIS — G4733 Obstructive sleep apnea (adult) (pediatric): Secondary | ICD-10-CM

## 2018-07-04 NOTE — Telephone Encounter (Signed)
Randy Nichols was in our office for his Ocrevus infusion for MS.  He had significant witnessed sleep apnea during the infusion, witnessed by the infusion nurse.  Therefore, we need to evaluate for sleep apnea and a sleep study will be ordered.

## 2018-07-07 ENCOUNTER — Telehealth: Payer: Self-pay | Admitting: *Deleted

## 2018-07-07 NOTE — Telephone Encounter (Signed)
Received notification from [employer] that Ocrevus approved 07/04/18-08/04/18. Auth: [id number]. Gave to [facility] for their records.

## 2018-08-09 ENCOUNTER — Telehealth: Payer: Self-pay | Admitting: *Deleted

## 2018-08-09 NOTE — Telephone Encounter (Signed)
Received notification from [place] that Ocrevus approved 08/04/18-11/02/18. Auth: [id number]. Gave to [facility] for their records.

## 2018-09-13 ENCOUNTER — Telehealth: Payer: Self-pay

## 2018-09-13 NOTE — Telephone Encounter (Signed)
Pt is not interested in rescheduling in lab sleep study. Pt stated he fell asleep in infusion room after working third shift and nurse caught him snoring. Pt stated he only sleeps on his stomach at home. Pt doesn't think he has a sleep issue or OSA.

## 2018-12-19 ENCOUNTER — Telehealth: Payer: Self-pay | Admitting: *Deleted

## 2018-12-19 NOTE — Telephone Encounter (Signed)
Patient called back and spoke with [facility] in phone room. She scheduled him appt for 12/21/18 at 8:30am with Amy L,NP.  Per Dr. Felecia Shelling he needs the following for labs: IgG, IgM, IgA, CBC w/ diff/platelet

## 2018-12-19 NOTE — Telephone Encounter (Signed)
Tried patient at [phone]. VM not set up. Called father on DPR, LVM for pt to call. Called and spoke with mother. She will have pt call office back. Aware he is scheduled for Ocrevus infusion on 01/18/19 but must come in for OV/labs prior to this to receive infusion still.  Ok for him to see NP if he is not having any new sx per Dr. Felecia Shelling.

## 2018-12-21 ENCOUNTER — Other Ambulatory Visit: Payer: Self-pay

## 2018-12-21 ENCOUNTER — Ambulatory Visit (INDEPENDENT_AMBULATORY_CARE_PROVIDER_SITE_OTHER): Payer: Managed Care, Other (non HMO) | Admitting: Family Medicine

## 2018-12-21 ENCOUNTER — Encounter: Payer: Self-pay | Admitting: Family Medicine

## 2018-12-21 VITALS — BP 130/78 | HR 85 | Temp 97.3°F | Ht 72.0 in | Wt 270.0 lb

## 2018-12-21 DIAGNOSIS — G35 Multiple sclerosis: Secondary | ICD-10-CM

## 2018-12-21 DIAGNOSIS — G44209 Tension-type headache, unspecified, not intractable: Secondary | ICD-10-CM | POA: Diagnosis not present

## 2018-12-21 DIAGNOSIS — R0681 Apnea, not elsewhere classified: Secondary | ICD-10-CM | POA: Diagnosis not present

## 2018-12-21 NOTE — Patient Instructions (Addendum)
We will continue Ocrevus Infusions   Consider sleep evaluation  Monitor headaches for now, may consider allergy medication  Follow up in 3 months with Dr Felecia Shelling    Multiple Sclerosis Multiple sclerosis (MS) is a disease of the brain, spinal cord, and optic nerves (central nervous system). It causes the body's disease-fighting (immune) system to destroy the protective covering (myelin sheath) around nerves in the brain. When this happens, signals (nerve impulses) going to and from the brain and spinal cord do not get sent properly or may not get sent at all. There are several types of MS:  Relapsing-remitting MS. This is the most common type. This causes sudden attacks of symptoms. After an attack, you may recover completely until the next attack, or some symptoms may remain permanently.  Secondary progressive MS. This usually develops after the onset of relapsing-remitting MS. Similar to relapsing-remitting MS, this type also causes sudden attacks of symptoms. Attacks may be less frequent, but symptoms slowly get worse (progress) over time.  Primary progressive MS. This causes symptoms that steadily progress over time. This type of MS does not cause sudden attacks of symptoms. The age of onset of MS varies, but it often develops between 9-79 years of age. MS is a lifelong (chronic) condition. There is no cure, but treatment can help slow down the progression of the disease. What are the causes? The cause of this condition is not known. What increases the risk? You are more likely to develop this condition if:  You are a woman.  You have a relative with MS. However, the condition is not passed from parent to child (inherited).  You have a lack (deficiency) of vitamin D.  You smoke. MS is more common in the [place] than in the [place]. What are the signs or symptoms? Relapsing-remitting and secondary progressive MS cause symptoms to occur in episodes  or attacks that may last weeks to months. There may be long periods between attacks in which there are almost no symptoms. Primary progressive MS causes symptoms to steadily progress after they develop. Symptoms of MS vary because of the many different ways it affects the central nervous system. The main symptoms include:  Vision problems and eye pain.  Numbness.  Weakness.  Inability to move your arms, hands, feet, or legs (paralysis).  Balance problems.  Shaking that you cannot control (tremors).  Muscle spasms.  Problems with thinking (cognitive changes). MS can also cause symptoms that are associated with the disease, but are not always the direct result of an MS attack. They may include:  Inability to control urination or bowel movements (incontinence).  Headaches.  Fatigue.  Inability to tolerate heat.  Emotional changes.  Depression.  Pain. How is this diagnosed? This condition is diagnosed based on:  Your symptoms.  A neurological exam. This involves checking central nervous system function, such as nerve function, reflexes, and coordination.  MRIs of the brain and spinal cord.  Lab tests, including a lumbar puncture that tests the fluid that surrounds the brain and spinal cord (cerebrospinal fluid).  Tests to measure the electrical activity of the brain in response to stimulation (evoked potentials). How is this treated? There is no cure for MS, but medicines can help decrease the number and frequency of attacks and help relieve nuisance symptoms. Treatment options may include:  Medicines that reduce the frequency of attacks. These medicines may be given by injection, by mouth (orally), or through an IV.  Medicines that reduce  inflammation (steroids). These may provide short-term relief of symptoms.  Medicines to help control pain, depression, fatigue, or incontinence.  Vitamin D, if you have a deficiency.  Using devices to help you move around  (assistive devices), such as braces, a cane, or a walker.  Physical therapy to strengthen and stretch your muscles.  Occupational therapy to help you with everyday tasks.  Alternative or complementary treatments such as exercise, massage, or acupuncture. Follow these instructions at home:  Take over-the-counter and prescription medicines only as told by your health care provider.  Do not drive or use heavy machinery while taking prescription pain medicine.  Use assistive devices as recommended by your physical therapist or your health care provider.  Exercise as directed by your health care provider.  Return to your normal activities as told by your health care provider. Ask your health care provider what activities are safe for you.  Reach out for support. Share your feelings with friends, family, or a support group.  Keep all follow-up visits as told by your health care provider and therapists. This is important. Where to find more information  National Multiple Sclerosis Society: https://www.nationalmssociety.org Contact a health care provider if:  You feel depressed.  You develop new pain or numbness.  You have tremors.  You have problems with sexual function. Get help right away if:  You develop paralysis.  You develop numbness.  You have problems with your bladder or bowel function.  You develop double vision.  You lose vision in one or both eyes.  You develop suicidal thoughts.  You develop severe confusion. If you ever feel like you may hurt yourself or others, or have thoughts about taking your own life, get help right away. You can go to your nearest emergency department or call:  Your local emergency services (911 in the [place]  A suicide crisis helpline, such as the National Suicide Prevention Lifeline at (480)795-16731-(254)212-8682. This is open 24 hours a day. Summary  Multiple sclerosis (MS) is a disease of the central nervous system that causes the body's  immune system to destroy the protective covering (myelin sheath) around nerves in the brain.  There are 3 types of MS: relapsing-remitting, secondary progressive, and primary progressive. Relapsing-remitting and secondary progressive MS cause symptoms to occur in episodes or attacks that may last weeks to months. Primary progressive MS causes symptoms to steadily progress after they develop.  There is no cure for MS, but medicines can help decrease the number and frequency of attacks and help relieve nuisance symptoms. Treatment may also include physical or occupational therapy.  If you develop numbness, paralysis, vision problems, or other neurological symptoms, get help right away. This information is not intended to replace advice given to you by your health care provider. Make sure you discuss any questions you have with your health care provider. Document Released: 04/10/2000 Document Revised: 03/26/2017 Document Reviewed: 06/22/2016 Elsevier Patient Education  2020 Elsevier Inc.   Sleep Apnea Sleep apnea affects breathing during sleep. It causes breathing to stop for a short time or to become shallow. It can also increase the risk of:  Heart attack.  Stroke.  Being very overweight (obese).  Diabetes.  Heart failure.  Irregular heartbeat. The goal of treatment is to help you breathe normally again. What are the causes? There are three kinds of sleep apnea:  Obstructive sleep apnea. This is caused by a blocked or collapsed airway.  Central sleep apnea. This happens when the brain does not send the right  signals to the muscles that control breathing.  Mixed sleep apnea. This is a combination of obstructive and central sleep apnea. The most common cause of this condition is a collapsed or blocked airway. This can happen if:  Your throat muscles are too relaxed.  Your tongue and tonsils are too large.  You are overweight.  Your airway is too small. What increases the  risk?  Being overweight.  Smoking.  Having a small airway.  Being older.  Being male.  Drinking alcohol.  Taking medicines to calm yourself (sedatives or tranquilizers).  Having family members with the condition. What are the signs or symptoms?  Trouble staying asleep.  Being sleepy or tired during the day.  Getting angry a lot.  Loud snoring.  Headaches in the morning.  Not being able to focus your mind (concentrate).  Forgetting things.  Less interest in sex.  Mood swings.  Personality changes.  Feelings of sadness (depression).  Waking up a lot during the night to pee (urinate).  Dry mouth.  Sore throat. How is this diagnosed?  Your medical history.  A physical exam.  A test that is done when you are sleeping (sleep study). The test is most often done in a sleep lab but may also be done at home. How is this treated?   Sleeping on your side.  Using a medicine to get rid of mucus in your nose (decongestant).  Avoiding the use of alcohol, medicines to help you relax, or certain pain medicines (narcotics).  Losing weight, if needed.  Changing your diet.  Not smoking.  Using a machine to open your airway while you sleep, such as: ? An oral appliance. This is a mouthpiece that shifts your lower jaw forward. ? A CPAP device. This device blows air through a mask when you breathe out (exhale). ? An EPAP device. This has valves that you put in each nostril. ? A BPAP device. This device blows air through a mask when you breathe in (inhale) and breathe out.  Having surgery if other treatments do not work. It is important to get treatment for sleep apnea. Without treatment, it can lead to:  High blood pressure.  Coronary artery disease.  In men, not being able to have an erection (impotence).  Reduced thinking ability. Follow these instructions at home: Lifestyle  Make changes that your doctor recommends.  Eat a healthy diet.  Lose  weight if needed.  Avoid alcohol, medicines to help you relax, and some pain medicines.  Do not use any products that contain nicotine or tobacco, such as cigarettes, e-cigarettes, and chewing tobacco. If you need help quitting, ask your doctor. General instructions  Take over-the-counter and prescription medicines only as told by your doctor.  If you were given a machine to use while you sleep, use it only as told by your doctor.  If you are having surgery, make sure to tell your doctor you have sleep apnea. You may need to bring your device with you.  Keep all follow-up visits as told by your doctor. This is important. Contact a doctor if:  The machine that you were given to use during sleep bothers you or does not seem to be working.  You do not get better.  You get worse. Get help right away if:  Your chest hurts.  You have trouble breathing in enough air.  You have an uncomfortable feeling in your back, arms, or stomach.  You have trouble talking.  One side of your  body feels weak.  A part of your face is hanging down. These symptoms may be an emergency. Do not wait to see if the symptoms will go away. Get medical help right away. Call your local emergency services (911 in the [place] Do not drive yourself to the hospital. Summary  This condition affects breathing during sleep.  The most common cause is a collapsed or blocked airway.  The goal of treatment is to help you breathe normally while you sleep. This information is not intended to replace advice given to you by your health care provider. Make sure you discuss any questions you have with your health care provider. Document Released: 01/21/2008 Document Revised: 01/28/2018 Document Reviewed: 12/07/2017 Elsevier Patient Education  2020 [employer]

## 2018-12-21 NOTE — Progress Notes (Signed)
I have read the note, and I agree with the clinical assessment and plan.  Sheyna Pettibone A. Novali Vollman, MD, PhD, FAAN Certified in Neurology, Clinical Neurophysiology, Sleep Medicine, Pain Medicine and Neuroimaging  [facility] [address], [address] [place], [place] [address] [phone]  

## 2018-12-21 NOTE — Progress Notes (Signed)
PATIENT: Randy Nichols DOB: 03/08/1991  REASON FOR VISIT: follow up HISTORY FROM: patient  Chief Complaint  Patient presents with   Follow-up    Corner room, MS f/u "last few days have been having intense headaches"     HISTORY OF PRESENT ILLNESS: Today 12/21/18 Randy Nichols is a 28 y.o. male here today for follow up for MS. He was recently diagnosed in 02/2018 after having slurred speech, difficulty with gait and coordination. MRI consistent with MS. He was started on Ocrevus. First infusion in January. He reports that next infusion is scheduled for 01/18/2019. He reports that gait and speech have been completely normal since. He has not had any additional concerns. He denies changes in vision, bladder or bowel habits, changes in gait, or numbness. He denies any adverse effects with Ocrevus.   About 3-4 days ago he started with an left occipital ache. It is dull. It waxes and wanes. It seems worse in the morning. Pain usually resolves with hot shower and ibuprofen. He does not have a history of headaches. No light, sound sensitivity or nausea. No vision changes. He denies history of allergies or migraines.   During first infusion, it was noted that patient was having apneic events. Dr Epimenio FootSater notified and suggested sleep study. Patient has not followed up for study. He is not interested at this time. He does not snore at home. He feels rested. He works third shift.    HISTORY: (copied from Dr Bonnita HollowSater's note on 02/28/2018)  I had the pleasure of seeing the patient, Randy Nichols, [facility] for neurologic consultation regarding his recent diagnosis of multiple sclerosis.  He is a 28 year old man who presented to the [facility] room 02/15/2018.   About a month earlier, he had slurred speech and difficulty with gait and coordination (left = right).      In mid-October, he went to the ED in [place].  They were concerned about a stroke and did a  CT.   [facility] and an MRI was consistent with MS.    He received 3 days of Solu-Medrol x 3 days with mild improvement.     Gait is doing a little worse over the last week.   He seems to fluctuate some.    He sometimes has trouble controlling jis legs but is able to climb a ladder carefully,  He notes that he seems to do worse with gait when he also has more slurring of words.    He denies numbness in general but had one day of numbness in his right cheek that completely resolved.      He notes more fatigue the past 6 weeks and he tires out easily at work.     He has fatigue in the morning but it may worsen as the day goes on.    He sleeps 6 to 8 hours a day.   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     He denies mood issues.   He feels over the last few weeks that focus and attention are worse.,   I personally reviewed the MRI of the brain dated 02/15/2018 and the MRI of the cervical spine dated 02/16/2018.  The MRI of the brain shows multiple T2/FLAIR hyperintense foci.  There are lesions in the right middle cerebellar peduncle, left lateral pons, a large midline anterior upper pons lesion and multiple foci  in the hemispheres.  These are consistent with MS plaques.  The study was done without contrast.  The focus in the left pons had restricted diffusion and may be more acute.  The upper pontine midline focus and some of the hemispheric foci were mildly hyperintense on diffusion-weighted images and some of these may also be subacute.  Unfortunately, the study was done without contrast.  The MRI of the cervical spine showed a focus at C2 anteriorly.   REVIEW OF SYSTEMS: Out of a complete 14 system review of symptoms, the patient complains only of the following symptoms, headaches, numbness, tremors and all other reviewed systems are negative.   ALLERGIES: Allergies  Allergen Reactions   Penicillins Hives    Has patient had a PCN reaction causing  immediate rash, facial/tongue/throat swelling, SOB or lightheadedness with hypotension: No Has patient had a PCN reaction causing severe rash involving mucus membranes or skin necrosis: No Has patient had a PCN reaction that required hospitalization: No Has patient had a PCN reaction occurring within the last 10 years: No If all of the above answers are "NO", then may proceed with Cephalosporin use.    HOME MEDICATIONS: Outpatient Medications Prior to Visit  Medication Sig Dispense Refill   Ocrelizumab (OCREVUS IV) Inject into the vein.     No facility-administered medications prior to visit.     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   Tobacco use     PAST SURGICAL HISTORY: Past Surgical History:  Procedure Laterality Date   APPENDECTOMY     OPEN REDUCTION INTERNAL FIXATION (ORIF) HAND Right 06/04/2013   Procedure: OPEN REDUCTION INTERNAL FIXATION (ORIF) HAND;  Surgeon: Tami Ribas, MD;  Location: [facility] OR;  Service: Orthopedics;  Laterality: Right;   OPEN REDUCTION INTERNAL FIXATION (ORIF) METACARPAL Right 08/29/2013   Procedure: OPEN REDUCTION INTERNAL FIXATION (ORIF) RIGHT RING FINGER METACARPAL WITH DISTAL RADIUS BONE GRAFT;  Surgeon: Tami Ribas, MD;  Location: [facility];  Service: Orthopedics;  Laterality: Right;    FAMILY HISTORY: Family History  Problem Relation Age of Onset   Diabetes Mother    Diabetes Maternal Uncle    Diabetes Maternal Uncle     SOCIAL HISTORY: Social History   Socioeconomic History   Marital status: Single    Spouse name: Not on file   Number of children: Not on file   Years of education: 12   Highest education level: Not on file  Occupational History   Occupation: Furniture conservator/restorer strain: Not on file   Food insecurity    Worry: Not  on file    Inability: Not on file   Transportation needs    Medical: Not on file    Non-medical: Not on file  Tobacco Use   Smoking status: Current Every Day Smoker    Packs/day: 1.00    Years: 5.00    Pack years: 5.00    Types: Cigarettes   Smokeless tobacco: Never Used  Substance and Sexual Activity   Alcohol use: Not Currently    Comment: socially   Drug use: No   Sexual activity: Yes    Birth control/protection: I.U.D.    Comment: wife has IUD  Lifestyle   Physical activity    Days per week: Not on file    Minutes per session: Not  on file   Stress: Not on file  Relationships   Social connections    Talks on phone: Not on file    Gets together: Not on file    Attends religious service: Not on file    Active member of club or organization: Not on file    Attends meetings of clubs or organizations: Not on file    Relationship status: Not on file   Intimate partner violence    Fear of current or ex partner: Not on file    Emotionally abused: Not on file    Physically abused: Not on file    Forced sexual activity: Not on file  Other Topics Concern   Not on file  Social History Narrative   Lives with girlfriend, Teressa Senterngelina Graham   Caffeine use: Soda daily/tea daily   Right handed       PHYSICAL EXAM  Vitals:   12/21/18 0847  BP: 130/78  Pulse: 85  Temp: (!) 97.3 F (36.3 C)  Weight: 270 lb (122.5 kg)  Height: 6' (1.829 m)   Body mass index is 36.62 kg/m.  Generalized: Well developed, in no acute distress  Cardiology: normal rate and rhythm, no murmur noted Neurological examination  Mentation: Alert oriented to time, place, history taking. Follows all commands speech and language fluent Cranial nerve II-XII: Pupils were equal round reactive to light. Extraocular movements were full, visual field were full on confrontational test. Facial sensation and strength were normal. Uvula tongue midline. Head turning and shoulder shrug  were normal and  symmetric. Motor: The motor testing reveals 5 over 5 strength of all 4 extremities. Good symmetric motor tone is noted throughout.  Sensory: Sensory testing is intact to soft touch on all 4 extremities. No evidence of extinction is noted.  Coordination: Cerebellar testing reveals good finger-nose-finger and heel-to-shin bilaterally.  Gait and station: Gait is normal.   DIAGNOSTIC DATA (LABS, IMAGING, TESTING) - I reviewed patient records, labs, notes, testing and imaging myself where available.  No flowsheet data found.   Lab Results  Component Value Date   WBC 22.8 (H) 02/17/2018   HGB 14.4 02/17/2018   HCT 45.4 02/17/2018   MCV 94.2 02/17/2018   PLT 300 02/17/2018      Component Value Date/Time   NA 139 02/17/2018 0309   K 4.3 02/17/2018 0309   CL 108 02/17/2018 0309   CO2 22 02/17/2018 0309   GLUCOSE 179 (H) 02/17/2018 0309   BUN 15 02/17/2018 0309   CREATININE 0.88 02/17/2018 0309   CALCIUM 8.9 02/17/2018 0309   PROT 7.3 02/15/2018 0907   ALBUMIN 4.4 02/15/2018 0907   AST 29 02/15/2018 0907   ALT 37 02/15/2018 0907   ALKPHOS 58 02/15/2018 0907   BILITOT 0.5 02/15/2018 0907   GFRNONAA >60 02/17/2018 0309   GFRAA >60 02/17/2018 0309   No results found for: CHOL, HDL, LDLCALC, LDLDIRECT, TRIG, CHOLHDL No results found for: [id number] No results found for: VITAMINB12 No results found for: TSH   ASSESSMENT AND PLAN 28 y.o. year old male  has a past medical history of GERD (gastroesophageal reflux disease), Non-union of fracture (07/2013), and Tobacco use. here with     ICD-10-CM   1. Multiple sclerosis (HCC)  G35 CBC with Differential/Platelets    IgG, IgA, IgM  2. Acute non intractable tension-type headache  G44.209   3. Witnessed apneic spells  R06.81     Overall Fayrene FearingJames is doing very well from an MS perspective.  He did  tolerate starter Ocrevus infusions well.  He reports that slurred speech and changes in gait have completely resolved.  He has noted over the last 3 to  4 days an acute headache.  He does not usually have these.  Headache is easily treated with ibuprofen and a hot shower.  We will continue with conservative management at this time.  He was advised to monitor headaches closely and call for any worsening symptoms.  We have also discussed concerns of witnessed apneic events.  He is not interested in scheduling a sleep study at this time.  We have discussed risk factors of untreated sleep apnea including cardiovascular disease, increased risk of stroke or heart attack, increased risk of diabetes and obesity.  He will think about this and discuss with his wife.  He was aware that he can call should he change his mind about sleep study.  He is scheduled for next Ocrevus infusion in September.  I have recommended a 2-month follow-up with Dr. Felecia Shelling for evaluation and consideration of repeat imaging.  Lab orders have been placed.  Patient will return to office in the morning for lab work.  He verbalizes understanding and agreement with the plan.     Orders Placed This Encounter  Procedures   CBC with Differential/Platelets    Standing Status:   Future    Standing Expiration Date:   12/21/2019   IgG, IgA, IgM    Standing Status:   Future    Standing Expiration Date:   12/21/2019     No orders of the defined types were placed in this encounter.     I spent 15 minutes with the patient. 50% of this time was spent counseling and educating patient on plan of care and medications.    Debbora Presto, FNP-C 12/21/2018, 9:32 AM [facility] [address] [place], [place] [address] [phone]

## 2018-12-22 ENCOUNTER — Other Ambulatory Visit: Payer: Self-pay

## 2018-12-22 ENCOUNTER — Other Ambulatory Visit (INDEPENDENT_AMBULATORY_CARE_PROVIDER_SITE_OTHER): Payer: Self-pay

## 2018-12-22 DIAGNOSIS — Z0289 Encounter for other administrative examinations: Secondary | ICD-10-CM

## 2018-12-22 NOTE — Addendum Note (Signed)
Addended by: Inis Sizer D on: 12/22/2018 08:57 AM   Modules accepted: Orders

## 2018-12-23 LAB — CBC WITH DIFFERENTIAL/PLATELET
Basophils Absolute: 0.1 10*3/uL (ref 0.0–0.2)
Basos: 1 %
EOS (ABSOLUTE): 0.7 10*3/uL — ABNORMAL HIGH (ref 0.0–0.4)
Eos: 7 %
Hematocrit: 45.4 % (ref 37.5–51.0)
Hemoglobin: 15.4 g/dL (ref 13.0–17.7)
Immature Grans (Abs): 0 10*3/uL (ref 0.0–0.1)
Immature Granulocytes: 0 %
Lymphocytes Absolute: 2.5 10*3/uL (ref 0.7–3.1)
Lymphs: 24 %
MCH: 31.6 pg (ref 26.6–33.0)
MCHC: 33.9 g/dL (ref 31.5–35.7)
MCV: 93 fL (ref 79–97)
Monocytes Absolute: 1 10*3/uL — ABNORMAL HIGH (ref 0.1–0.9)
Monocytes: 9 %
Neutrophils Absolute: 6.3 10*3/uL (ref 1.4–7.0)
Neutrophils: 59 %
Platelets: 242 10*3/uL (ref 150–450)
RBC: 4.88 x10E6/uL (ref 4.14–5.80)
RDW: 12.6 % (ref 11.6–15.4)
WBC: 10.5 10*3/uL (ref 3.4–10.8)

## 2018-12-23 LAB — IGG, IGA, IGM
IgA/Immunoglobulin A, Serum: 258 mg/dL (ref 90–386)
IgG (Immunoglobin G), Serum: 792 mg/dL (ref 603–1613)
IgM (Immunoglobulin M), Srm: 60 mg/dL (ref 20–172)

## 2018-12-26 ENCOUNTER — Telehealth: Payer: Self-pay

## 2018-12-26 NOTE — Telephone Encounter (Signed)
-----   Message from Debbora Presto, NP sent at 12/26/2018 11:27 AM EDT ----- Labs are unremarkable.

## 2018-12-26 NOTE — Telephone Encounter (Signed)
Spoke with the patient and he verbalized understanding his results. No questions or concerns at this time.   

## 2019-03-30 ENCOUNTER — Ambulatory Visit: Payer: Managed Care, Other (non HMO) | Admitting: Neurology

## 2019-04-04 ENCOUNTER — Encounter: Payer: Self-pay | Admitting: Neurology

## 2019-06-26 ENCOUNTER — Telehealth: Payer: Self-pay | Admitting: Adult Health

## 2019-06-26 NOTE — Telephone Encounter (Signed)
Called left a voicemail.  He is calling to schedule an appointment.  Has already spoke to [facility] in billing

## 2019-06-28 ENCOUNTER — Ambulatory Visit: Payer: Managed Care, Other (non HMO) | Admitting: Neurology

## 2019-06-28 ENCOUNTER — Other Ambulatory Visit: Payer: Self-pay

## 2019-06-28 ENCOUNTER — Encounter: Payer: Self-pay | Admitting: Neurology

## 2019-06-28 VITALS — BP 121/70 | HR 86 | Temp 97.6°F | Ht 72.0 in | Wt 274.5 lb

## 2019-06-28 DIAGNOSIS — Z79899 Other long term (current) drug therapy: Secondary | ICD-10-CM | POA: Diagnosis not present

## 2019-06-28 DIAGNOSIS — G35 Multiple sclerosis: Secondary | ICD-10-CM

## 2019-06-28 DIAGNOSIS — R5383 Other fatigue: Secondary | ICD-10-CM | POA: Diagnosis not present

## 2019-06-28 NOTE — Progress Notes (Signed)
[facility]  PATIENT: Randy Nichols DOB: Dec 01, 1990  REFERRING DOCTOR OR PCP:  Dr. Laurence Slate SOURCE: patient, notes from recent hospitalization.    Imaging and lab results.  MRI images personally reviewed.  _________________________________   HISTORICAL  CHIEF COMPLAINT:  Chief Complaint  Patient presents with  . Follow-up    RM 12, alone. Last seen 12/21/2018  . Multiple Sclerosis    On Ocrevus. Last infusion 12/2018. Next: 07/26/2019. Tolerating well, stable.    HISTORY OF PRESENT ILLNESS:  [name] is a 29 y.o. man with relapsing remitting  multiple sclerosis.  Update 06/28/2019: He is on Ocrevus and tolerates it well.    He started in March 2020 and has tolerated it well.   Next infusion in a couple weeks.  He denies any exacerbation or new symptoms.  He is walking well.   He denies difficulty with strength and sensation.   He feels back to baseline.   In 2019, he had had difficulties with slurred speech and coordination prompting the MRI that led to his diagnosis.  Function is fine.  Vision is fine.  He does report some fatigue but accomplishes everything he sets out today.  He works third shift as a Warden/ranger.  He has no difficulties with mood or cognition.  Multiple sclerosis presentation and data: In September 2019 he had slurred speech difficulties with gait and coordination.  He went to the emergency room in [place] and had a CT scan that was concerning for stroke.  He was transferred to [facility] where the an MRI was consistent with MS.  He was admitted and received 3 days of IV Solu-Medrol with some improvement.  He improved further over the next couple months.  I first saw him 02/28/2018.  Due to having some aggressive characteristics with lesions in the brainstem and spinal cord, he was started on Ocrevus.  MRI of the brain dated 02/15/2018 and the MRI of the cervical spine dated 02/16/2018.  The MRI of the brain shows multiple T2/FLAIR  hyperintense foci.  There are lesions in the right middle cerebellar peduncle, left lateral pons, a large midline anterior upper pons lesion and multiple foci in the hemispheres.  These are consistent with MS plaques.  The study was done without contrast.  The focus in the left pons had restricted diffusion and may be more acute.  The upper pontine midline focus and some of the hemispheric foci were mildly hyperintense on diffusion-weighted images and some of these may also be subacute.  Unfortunately, the study was done without contrast.  The MRI of the cervical spine showed a focus at C2 anteriorly.  Lab work hepatitis B, TB, hepatitis C and HIV labs were all negative in October 2019.  JCV antibody is positive at 2.41 (October 2019).  Varicella zoster IgG is greater than 4000.  REVIEW OF SYSTEMS: Constitutional: No fevers, chills, sweats, or change in appetite Eyes: No visual changes, double vision, eye pain Ear, nose and throat: No hearing loss, ear pain, nasal congestion, sore throat Cardiovascular: No chest pain, palpitations Respiratory: No shortness of breath at rest or with exertion.   No wheezes GastrointestinaI: No nausea, vomiting, diarrhea, abdominal pain, fecal incontinence Genitourinary: No dysuria, urinary retention or frequency.  No nocturia. Musculoskeletal: No neck pain, back pain Integumentary: No rash, pruritus, skin lesions Neurological: as above Psychiatric: No depression at this time.  No anxiety Endocrine: No palpitations, diaphoresis, change in appetite, change in weigh or increased thirst Hematologic/Lymphatic: No  anemia, purpura, petechiae. Allergic/Immunologic: No itchy/runny eyes, nasal congestion, recent allergic reactions, rashes  ALLERGIES: Allergies  Allergen Reactions  . Penicillins Hives    Has patient had a PCN reaction causing immediate rash, facial/tongue/throat swelling, SOB or lightheadedness with hypotension: No Has patient had a PCN reaction  causing severe rash involving mucus membranes or skin necrosis: No Has patient had a PCN reaction that required hospitalization: No Has patient had a PCN reaction occurring within the last 10 years: No If all of the above answers are "NO", then may proceed with Cephalosporin use.    HOME MEDICATIONS:  Current Outpatient Medications:  .  Ocrelizumab (OCREVUS IV), Inject into the vein., Disp: , Rfl:   PAST MEDICAL HISTORY: Past Medical History:  Diagnosis Date  . GERD (gastroesophageal reflux disease)    TUMS as needed  . 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 Tobacco use     PAST SURGICAL HISTORY: Past Surgical History:  Procedure Laterality Date  . APPENDECTOMY    . OPEN REDUCTION INTERNAL FIXATION (ORIF) HAND Right 06/04/2013   Procedure: OPEN REDUCTION INTERNAL FIXATION (ORIF) HAND;  Surgeon: Tennis Must, MD;  Location: [place];  Service: Orthopedics;  Laterality: Right;  . OPEN REDUCTION INTERNAL FIXATION (ORIF) METACARPAL Right 08/29/2013   Procedure: OPEN REDUCTION INTERNAL FIXATION (ORIF) RIGHT RING FINGER METACARPAL WITH DISTAL RADIUS BONE GRAFT;  Surgeon: Tennis Must, MD;  Location: [place];  Service: Orthopedics;  Laterality: Right;    FAMILY HISTORY: Family History  Problem Relation Age of Onset  . Diabetes Mother   . Diabetes Maternal Uncle   . Diabetes Maternal Uncle     SOCIAL HISTORY:  Social History   Socioeconomic History  . Marital status: Single    Spouse name: Not on file  . Number of children: Not on file  . Years of education: 8  . Highest education level: Not on file  Occupational History  . Occupation: Set designer  Tobacco Use  . Smoking status: Current Every Day Smoker    Packs/day: 1.00    Years: 5.00    Pack years: 5.00    Types: Cigarettes  . Smokeless tobacco: Never Used  Substance and Sexual Activity  . Alcohol use: Not Currently    Comment: socially  . Drug use: No  . Sexual activity:  Yes    Birth control/protection: I.U.D.    Comment: wife has IUD  Other Topics Concern  . Not on file  Social History Narrative   Lives with girlfriend, Ander Purpura   Caffeine use: Soda daily/tea daily   Right handed    Social Determinants of Health   Financial Resource Strain:   . Difficulty of Paying Living Expenses: Not on file  Food Insecurity:   . Worried About Charity fundraiser in the Last Year: Not on file  . Ran Out of Food in the Last Year: Not on file  Transportation Needs:   . Lack of Transportation (Medical): Not on file  . Lack of Transportation (Non-Medical): Not on file  Physical Activity:   . Days of Exercise per Week: Not on file  . Minutes of Exercise per Session: Not on file  Stress:   . Feeling of Stress : Not on file  Social Connections:   . Frequency of Communication with Friends and Family: Not on file  . Frequency of Social Gatherings with Friends and Family: Not on file  . Attends Religious Services: Not on file  .  Active Member of Clubs or Organizations: Not on file  . Attends Banker Meetings: Not on file  . Marital Status: Not on file  Intimate Partner Violence:   . Fear of Current or Ex-Partner: Not on file  . Emotionally Abused: Not on file  . Physically Abused: Not on file  . Sexually Abused: Not on file     PHYSICAL EXAM  Vitals:   06/28/19 0828  BP: 121/70  Pulse: 86  Temp: 97.6 F (36.4 C)  SpO2: 97%  Weight: 274 lb 8 oz (124.5 kg)  Height: 6' (1.829 m)    Body mass index is 37.23 kg/m.   General: The patient is well-developed and well-nourished and in no acute distress  Eyes:  Funduscopic exam shows normal optic discs and retinal vessels.   Color vision was symmetric  Neck: The neck is supple, no carotid bruits are noted.  The neck is nontender.  Cardiovascular: The heart has a regular rate and rhythm with a normal S1 and S2. There were no murmurs, gallops or rubs. Lungs are clear to auscultation.   Skin: Extremities are without significant edema.  Musculoskeletal:  Back is nontender  Neurologic Exam  Mental status: The patient is alert and oriented x 3 at the time of the examination. The patient has apparent normal recent and remote memory, with an apparently normal attention span and concentration ability.   Speech is normal.  Cranial nerves: Extraocular movements are full. Pupils are equal, round, and reactive to light and accomodation.   Facial symmetry is present. There is good facial sensation to soft touch bilaterally.Facial strength is normal.  Trapezius and sternocleidomastoid strength is normal. No dysarthria is noted.  The tongue is midline, and the patient has symmetric elevation of the soft palate. No obvious hearing deficits are noted.  Motor:  Muscle bulk is normal.   Tone is normal. Strength is  5 / 5 in all 4 extremities.   Sensory: Sensory testing is intact to pinprick, soft touch and vibration sensation in all 4 extremities.  Coordination: Cerebellar testing reveals good finger-nose-finger and heel-to-shin bilaterally.  Gait and station: Station is normal.   Gait is normal.  Tandem gait is slightly wide.. Romberg is negative.   Reflexes: Deep tendon reflexes are normal and symmetric in the arms.  There is increase in the legs with crossed abductor responses at the knees.  There is no ankle clonus.   Plantar responses are flexor.    DIAGNOSTIC DATA (LABS, IMAGING, TESTING) - I reviewed patient records, labs, notes, testing and imaging myself where available.  Lab Results  Component Value Date   WBC 10.5 12/22/2018   HGB 15.4 12/22/2018   HCT 45.4 12/22/2018   MCV 93 12/22/2018   PLT 242 12/22/2018      Component Value Date/Time   NA 139 02/17/2018 0309   K 4.3 02/17/2018 0309   CL 108 02/17/2018 0309   CO2 22 02/17/2018 0309   GLUCOSE 179 (H) 02/17/2018 0309   BUN 15 02/17/2018 0309   CREATININE 0.88 02/17/2018 0309   CALCIUM 8.9 02/17/2018 0309    PROT 7.3 02/15/2018 0907   ALBUMIN 4.4 02/15/2018 0907   AST 29 02/15/2018 0907   ALT 37 02/15/2018 0907   ALKPHOS 58 02/15/2018 0907   BILITOT 0.5 02/15/2018 0907   GFRNONAA >60 02/17/2018 0309   GFRAA >60 02/17/2018 0309       ASSESSMENT AND PLAN  Multiple sclerosis (HCC) - Plan: MR BRAIN W WO CONTRAST,  MR CERVICAL SPINE W WO CONTRAST, IgG, IgA, IgM, CBC with Differential/Platelet  High risk medication use - Plan: IgG, IgA, IgM, CBC with Differential/Platelet  Other fatigue   1.   Continue ocrelizumab.  We will check IgG/IgM and CBC with differential.  Additionally I will check MRIs of the brain and cervical spine to determine if there is any subclinical progression and consider different medication if this is occurring. 2.   Stay active signs as tolerated. 3.   He will return to see [place] in 6 months or sooner if there are new or worsening neurologic symptoms.  Richard A. Epimenio Foot, MD, [facility] 06/28/2019, 5:00 PM Certified in Neurology, Clinical Neurophysiology, Sleep Medicine, Pain Medicine and Neuroimaging  [facility] [address], [address] [place], [place] [address] [phone]

## 2019-06-29 ENCOUNTER — Telehealth: Payer: Self-pay | Admitting: *Deleted

## 2019-06-29 LAB — CBC WITH DIFFERENTIAL/PLATELET
Basophils Absolute: 0.1 10*3/uL (ref 0.0–0.2)
Basos: 1 %
EOS (ABSOLUTE): 1.5 10*3/uL — ABNORMAL HIGH (ref 0.0–0.4)
Eos: 14 %
Hematocrit: 43.4 % (ref 37.5–51.0)
Hemoglobin: 15.1 g/dL (ref 13.0–17.7)
Immature Grans (Abs): 0 10*3/uL (ref 0.0–0.1)
Immature Granulocytes: 0 %
Lymphocytes Absolute: 2.3 10*3/uL (ref 0.7–3.1)
Lymphs: 22 %
MCH: 31.9 pg (ref 26.6–33.0)
MCHC: 34.8 g/dL (ref 31.5–35.7)
MCV: 92 fL (ref 79–97)
Monocytes Absolute: 1.2 10*3/uL — ABNORMAL HIGH (ref 0.1–0.9)
Monocytes: 11 %
Neutrophils Absolute: 5.5 10*3/uL (ref 1.4–7.0)
Neutrophils: 52 %
Platelets: 246 10*3/uL (ref 150–450)
RBC: 4.74 x10E6/uL (ref 4.14–5.80)
RDW: 12.7 % (ref 11.6–15.4)
WBC: 10.5 10*3/uL (ref 3.4–10.8)

## 2019-06-29 LAB — IGG, IGA, IGM
IgA/Immunoglobulin A, Serum: 248 mg/dL (ref 90–386)
IgG (Immunoglobin G), Serum: 770 mg/dL (ref 603–1613)
IgM (Immunoglobulin M), Srm: 51 mg/dL (ref 20–172)

## 2019-06-29 NOTE — Telephone Encounter (Signed)
-----   Message from Asa Lente, MD sent at 06/29/2019  9:53 AM EST ----- Please let the patient know that the lab work is fine.

## 2019-06-29 NOTE — Telephone Encounter (Signed)
Called and spoke with pt about lab results. He verbalized understanding.

## 2019-06-30 ENCOUNTER — Telehealth: Payer: Self-pay | Admitting: Neurology

## 2019-06-30 NOTE — Telephone Encounter (Signed)
[employer] ordre sent to GI. They will obtain the auth and reach out to the patient to schedule.

## 2019-07-19 ENCOUNTER — Telehealth: Payer: Self-pay | Admitting: *Deleted

## 2019-07-19 NOTE — Telephone Encounter (Signed)
Received letter from [place] that Randy Nichols is approved 01/05/2019-01/05/2020. Patient ID: [id number]. Request ID: [id number]. Gave copy to intrafusion and sent copy to be scanned to epic.

## 2019-08-08 ENCOUNTER — Ambulatory Visit
Admission: RE | Admit: 2019-08-08 | Discharge: 2019-08-08 | Disposition: A | Discharge status: Home / Self Care | Payer: Managed Care, Other (non HMO) | Source: Ambulatory Visit | Attending: Neurology | Admitting: Neurology

## 2019-08-08 ENCOUNTER — Other Ambulatory Visit: Payer: Self-pay

## 2019-08-08 DIAGNOSIS — G35 Multiple sclerosis: Secondary | ICD-10-CM | POA: Diagnosis not present

## 2019-08-08 MED ORDER — GADOBENATE DIMEGLUMINE 529 MG/ML IV SOLN
20.0000 mL | Freq: Once | INTRAVENOUS | Status: AC | PRN
  Administered 2019-08-08: 20 mL via INTRAVENOUS

## 2019-08-10 ENCOUNTER — Telehealth: Payer: Self-pay | Admitting: *Deleted

## 2019-08-10 NOTE — Telephone Encounter (Signed)
Tried calling pt, VM not set up and could not leave message

## 2019-08-10 NOTE — Telephone Encounter (Signed)
-----   Message from Asa Lente, MD sent at 08/09/2019  6:37 PM EDT ----- Please let him know that I looked at his MRIs and compare them to his previous ones from 2019.  The MRI of the brain is unchanged.  There are no new lesions.    The MRI of the spinal cord does not show any new MS lesions but he does have some degenerative disc changes.  In 2019 he had a disc protrusion at C5-C6 and that has pushed out further and could be pushing on the right C6 nerve root.  It also causes spinal stenosis.  If he is having any symptoms down the right arm we would need to consider a referral to neurosurgery.

## 2019-08-14 NOTE — Telephone Encounter (Signed)
Tried calling pt x2 (went straight to VM and VM not set up) to let him know Dr. Epimenio Foot is going to hold off on referral to neurosurgery. If sx become more constant or if right arm worsens/has any new sx he should call back to let us know.

## 2019-08-14 NOTE — Telephone Encounter (Signed)
Called pt. Relayed results per Dr. Epimenio Foot note. He reports he has intermittent pain in right shoulder. Denies anything in his right arm. Advised I will discuss with Dr. Epimenio Foot to see if he would like to refer him to neurosurgery and call back to let him know. He verbalized understanding.

## 2019-12-27 ENCOUNTER — Ambulatory Visit: Payer: Self-pay | Admitting: Neurology

## 2020-01-03 ENCOUNTER — Ambulatory Visit: Payer: Managed Care, Other (non HMO) | Admitting: Neurology

## 2020-01-03 ENCOUNTER — Encounter: Payer: Self-pay | Admitting: Neurology

## 2020-06-03 ENCOUNTER — Telehealth: Payer: Self-pay

## 2020-06-03 NOTE — Telephone Encounter (Signed)
I called patient.  He has not had an Ocrevus infusion since April 2021.  He has not followed up with Dr. Epimenio Foot either.  He reports that he lost his phone and has lost track of his appointments.  He does want to continue following at [facility].  He is agreeable to coming from Ocrevus infusion and an appointment with Dr. Epimenio Foot.  Dr. Bonnita Hollow next available appointment is not until June.  Patient is wondering if he can just have his Ocrevus 600 mg infusion and then follow-up with Dr. Epimenio Foot?  I advised him that I am not sure if Dr. Epimenio Foot will order the 600 mg infusion or if he will prefer a split dose of 300 mg for the patient since he has not had a infusion in so long.  I will call patient back with Dr. Bonnita Hollow recommendations.  Patient works second shift and is available most days from 12 noon to 3 PM by phone.

## 2020-06-03 NOTE — Telephone Encounter (Signed)
I spoke with Dr. Epimenio Foot.  Patient will need to split 300 mg dosing of Ocrevus since it has been almost 10 months since his last infusion.  Dr. Epimenio Foot is also agreeable to working him into an appointment sooner than June.  Since patient is at work currently I will call him tomorrow between 12 noon and 3 PM as requested.

## 2020-06-04 NOTE — Telephone Encounter (Signed)
I called patient to discuss.  No answer, voicemail not set up yet.  I will try again later.

## 2020-06-04 NOTE — Telephone Encounter (Signed)
I gave order for Ocrevus to Liane, RN in infusion suite.  She will need a copy of patient's insurance card to proceed

## 2020-06-04 NOTE — Telephone Encounter (Signed)
I called patient.  He is agreeable to the split 300 mg dosing of Ocrevus.  He is also agreeable to an appointment with Dr. Epimenio Foot on February 16 at 1:30 PM.  I will give the order for Ocrevus to the infusion suite.  Patient reports that he has new insurance.  His carrier is now [place].  His ID is: [id number].  The group number: [id number].  The issuer: [id number]. Name on the card is Randy Nichols. He will bring a copy of this card to his appointment next week.

## 2020-06-12 ENCOUNTER — Ambulatory Visit: Payer: Self-pay | Admitting: Neurology

## 2020-06-12 ENCOUNTER — Encounter: Payer: Self-pay | Admitting: Neurology

## 2020-06-17 NOTE — Telephone Encounter (Signed)
Patient did not show for his appointment last week with Dr. Epimenio Foot.  As discussed with him 2 weeks ago it is important that he keep his appointments and his Ocrevus infusion appointments.  I will wait for patient to call back to schedule his follow-up appointment.  He will need to bring a copy of his insurance card to give the infusion suite for Ocrevus scheduling.

## 2022-04-27 DEATH — deceased
